# Patient Record
Sex: Male | Born: 1975 | Race: White | Hispanic: No | Marital: Married | Smoking: Never smoker
Health system: Southern US, Community
[De-identification: ages and names within clinical notes are randomized; demographics above are authoritative.]

## PROBLEM LIST (undated history)

## (undated) DIAGNOSIS — E785 Hyperlipidemia, unspecified: Secondary | ICD-10-CM

## (undated) DIAGNOSIS — K219 Gastro-esophageal reflux disease without esophagitis: Secondary | ICD-10-CM

## (undated) DIAGNOSIS — D225 Melanocytic nevi of trunk: Secondary | ICD-10-CM

## (undated) HISTORY — DX: Gastro-esophageal reflux disease without esophagitis: K21.9

## (undated) HISTORY — PX: SKIN LESION EXCISION: SHX2412

## (undated) HISTORY — DX: Hyperlipidemia, unspecified: E78.5

## (undated) HISTORY — PX: TONSILLECTOMY AND ADENOIDECTOMY: SUR1326

## (undated) HISTORY — PX: LASIK: SHX215

## (undated) HISTORY — DX: Melanocytic nevi of trunk: D22.5

---

## 2005-11-03 ENCOUNTER — Ambulatory Visit: Payer: Self-pay | Admitting: Internal Medicine

## 2006-02-09 ENCOUNTER — Ambulatory Visit: Payer: Self-pay | Admitting: Internal Medicine

## 2006-02-22 ENCOUNTER — Ambulatory Visit: Payer: Self-pay | Admitting: Internal Medicine

## 2006-02-24 ENCOUNTER — Ambulatory Visit: Payer: Self-pay | Admitting: Internal Medicine

## 2006-02-24 LAB — CONVERTED CEMR LAB
ALT: 37 units/L (ref 0–40)
AST: 24 units/L (ref 0–37)

## 2006-03-01 ENCOUNTER — Ambulatory Visit: Payer: Self-pay | Admitting: Internal Medicine

## 2006-06-24 ENCOUNTER — Ambulatory Visit: Payer: Self-pay | Admitting: Internal Medicine

## 2006-06-24 LAB — CONVERTED CEMR LAB
ALT: 48 units/L — ABNORMAL HIGH (ref 0–40)
AST: 30 units/L (ref 0–37)
HDL: 46.2 mg/dL (ref >39.0)
VLDL: 30 mg/dL (ref 0–40)

## 2007-05-13 ENCOUNTER — Emergency Department (HOSPITAL_COMMUNITY): Admission: EM | Admit: 2007-05-13 | Discharge: 2007-05-14 | Payer: Self-pay | Admitting: Emergency Medicine

## 2007-05-17 ENCOUNTER — Encounter: Payer: Self-pay | Admitting: Internal Medicine

## 2007-10-12 ENCOUNTER — Ambulatory Visit: Payer: Self-pay | Admitting: *Deleted

## 2007-10-12 DIAGNOSIS — K219 Gastro-esophageal reflux disease without esophagitis: Secondary | ICD-10-CM | POA: Insufficient documentation

## 2007-10-12 DIAGNOSIS — E785 Hyperlipidemia, unspecified: Secondary | ICD-10-CM

## 2007-10-12 DIAGNOSIS — E7800 Pure hypercholesterolemia, unspecified: Secondary | ICD-10-CM | POA: Insufficient documentation

## 2007-10-12 LAB — CONVERTED CEMR LAB
BUN: 15 mg/dL (ref 6–23)
Basophils Relative: 1 % (ref 0.0–3.0)
CO2: 27 meq/L (ref 19–32)
Chloride: 105 meq/L (ref 96–112)
Creatinine, Ser: 1 mg/dL (ref 0.4–1.5)
Eosinophils Relative: 2.4 % (ref 0.0–5.0)
Lymphocytes Relative: 27.9 % (ref 12.0–46.0)
MCV: 84.7 fL (ref 78.0–100.0)
Monocytes Relative: 3.8 % (ref 3.0–12.0)
Neutrophils Relative %: 64.9 % (ref 43.0–77.0)
Platelets: 236 10*3/uL (ref 150–400)
Potassium: 4.2 meq/L (ref 3.5–5.1)
RBC: 4.92 M/uL (ref 4.22–5.81)
WBC: 6 10*3/uL (ref 4.5–10.5)

## 2008-02-07 ENCOUNTER — Ambulatory Visit: Payer: Self-pay | Admitting: Internal Medicine

## 2008-02-13 LAB — CONVERTED CEMR LAB
ALT: 40 units/L (ref 0–53)
AST: 34 units/L (ref 0–37)
Alkaline Phosphatase: 59 units/L (ref 39–117)
Basophils Absolute: 0 10*3/uL (ref 0.0–0.1)
Basophils Relative: 0.1 % (ref 0.0–3.0)
CO2: 29 meq/L (ref 19–32)
Chloride: 105 meq/L (ref 96–112)
Creatinine, Ser: 1 mg/dL (ref 0.4–1.5)
Eosinophils Absolute: 0.2 10*3/uL (ref 0.0–0.7)
Eosinophils Relative: 3.8 % (ref 0.0–5.0)
GFR calc non Af Amer: 91 mL/min
HDL: 53.9 mg/dL (ref >39.0)
Lymphocytes Relative: 33.1 % (ref 12.0–46.0)
MCHC: 35.5 g/dL (ref 30.0–36.0)
MCV: 83.9 fL (ref 78.0–100.0)
Neutrophils Relative %: 55.3 % (ref 43.0–77.0)
Platelets: 228 10*3/uL (ref 150–400)
Potassium: 4.1 meq/L (ref 3.5–5.1)
RBC: 4.95 M/uL (ref 4.22–5.81)
Total Bilirubin: 1.7 mg/dL — ABNORMAL HIGH (ref 0.3–1.2)
Triglycerides: 89 mg/dL (ref 0–149)
VLDL: 18 mg/dL (ref 0–40)
WBC: 4.5 10*3/uL (ref 4.5–10.5)

## 2008-03-12 ENCOUNTER — Telehealth (INDEPENDENT_AMBULATORY_CARE_PROVIDER_SITE_OTHER): Payer: Self-pay | Admitting: *Deleted

## 2008-11-06 ENCOUNTER — Ambulatory Visit: Payer: Self-pay | Admitting: Family Medicine

## 2008-11-06 DIAGNOSIS — IMO0002 Reserved for concepts with insufficient information to code with codable children: Secondary | ICD-10-CM | POA: Insufficient documentation

## 2009-09-24 ENCOUNTER — Ambulatory Visit: Payer: Self-pay | Admitting: Internal Medicine

## 2009-09-25 ENCOUNTER — Ambulatory Visit: Payer: Self-pay | Admitting: Internal Medicine

## 2009-09-26 LAB — CONVERTED CEMR LAB
ALT: 23 units/L (ref 0–53)
AST: 24 units/L (ref 0–37)
Albumin: 4.5 g/dL (ref 3.5–5.2)
Alkaline Phosphatase: 62 units/L (ref 39–117)
Basophils Absolute: 0 10*3/uL (ref 0.0–0.1)
Bilirubin, Direct: 0.2 mg/dL (ref 0.0–0.3)
Chloride: 101 meq/L (ref 96–112)
Cholesterol: 235 mg/dL — ABNORMAL HIGH (ref 0–200)
Creatinine, Ser: 1 mg/dL (ref 0.4–1.5)
Eosinophils Absolute: 0.2 10*3/uL (ref 0.0–0.7)
Lymphocytes Relative: 31.5 % (ref 12.0–46.0)
MCHC: 35.2 g/dL (ref 30.0–36.0)
Monocytes Relative: 9.1 % (ref 3.0–12.0)
Neutro Abs: 2.7 10*3/uL (ref 1.4–7.7)
Neutrophils Relative %: 55.4 % (ref 43.0–77.0)
Platelets: 225 10*3/uL (ref 150.0–400.0)
Potassium: 4.4 meq/L (ref 3.5–5.1)
RDW: 13.1 % (ref 11.5–14.6)
Sodium: 137 meq/L (ref 135–145)
TSH: 1.32 microintl units/mL (ref 0.35–5.50)
Total Protein: 7.2 g/dL (ref 6.0–8.3)
Triglycerides: 115 mg/dL (ref 0.0–149.0)
VLDL: 23 mg/dL (ref 0.0–40.0)

## 2010-04-14 NOTE — Assessment & Plan Note (Signed)
Summary: cpx/cbs   Vital Signs:  Patient profile:   35 year old male Height:      68.25 inches Weight:      176.38 pounds BMI:     26.72 Pulse rate:   67 / minute Pulse rhythm:   regular BP sitting:   126 / 80  (left arm) Cuff size:   large  Vitals Entered By: Army Fossa CMA (September 24, 2009 1:17 PM) CC: CPX: fasting Comments Has a sore on his leg that has been there for 1 year.    History of Present Illness: CPX rash in the R leg x 1 year no itch or pain or d/c   Preventive Screening-Counseling & Management  Caffeine-Diet-Exercise     Times/week: 5  Allergies: 1)  ! Pcn  Past History:  Past Medical History: Reviewed history from 02/07/2008 and no changes required. GERD - controlled with diet / behavioral changes Hyperlipidemia - controlled with diet  Past Surgical History: Tonsillectomy / adenoids - 1980 cyst removed from skin (R hip) LASIK surgery   Family History: Reviewed history from 02/07/2008 and no changes required. 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 CV:  Denies chest pain or discomfort, palpitations, and swelling of feet. Resp:  Denies cough and shortness of breath. GI:  Denies bloody stools, diarrhea, and nausea. GU:  Denies dysuria, hematuria, urinary frequency, and urinary hesitancy. Psych:  patient states he worries a lot , has been that way all his life and does not affect his family life or job.  Physical Exam  General:  alert, well-developed, and well-nourished.   Neck:  no masses, no thyromegaly, and normal carotid upstroke.   Lungs:  normal respiratory effort, no  intercostal retractions, no accessory muscle use, and normal breath sounds.   Heart:  normal rate, regular rhythm, and no murmur.   Abdomen:  soft, non-tender, no masses, no guarding, and no rigidity.   Extremities:  no pretibial edema bilaterally  Skin:  r pretibial area has a minute (1mm) shallow wound w/ mild redness around  Psych:  Cognition and judgment appear intact. Alert and cooperative with normal attention span and concentration.     Impression & Recommendations:  Problem # 1:  HEALTH SCREENING (ICD-V70.0) Td 2004  EKG (baseline)---sinus brady h/o increased cholesterol before, h/o increased LFTs (see previous note) that resolved labs  continue healthy life style  Problem # 2:  minute skin lesion: ?staph infex mupirocin will RTC for excision if no better   Complete Medication List: 1)  Mupirocin 2 % Oint (Mupirocin) .... Apply two times a day x 10 days  Patient Instructions: 1)  came back fasting: 2)  FLP LFTs CBC BMP TSH----dx v70 3)  if the skin lesion is no better , came back for excision 4)  Please schedule a follow-up appointment in 1 year.  Prescriptions: MUPIROCIN 2 % OINT (MUPIROCIN) apply two times a day x 10 days  #1 x 0   Entered and Authorized by:   Nolon Rod. Jaece Ducharme MD   Signed by:   Nolon Rod. Chala Gul MD on 09/24/2009   Method used:   Print then Give to Patient  RxID:   [id number]      Risk Factors:  Exercise:  yes    Times per week:  5

## 2010-06-30 ENCOUNTER — Emergency Department (HOSPITAL_BASED_OUTPATIENT_CLINIC_OR_DEPARTMENT_OTHER)
Admission: EM | Admit: 2010-06-30 | Discharge: 2010-07-01 | Disposition: A | Discharge status: Home / Self Care | Payer: BC Managed Care – PPO | Attending: Emergency Medicine | Admitting: Emergency Medicine

## 2010-06-30 DIAGNOSIS — Y9365 Activity, lacrosse and field hockey: Secondary | ICD-10-CM | POA: Insufficient documentation

## 2010-06-30 DIAGNOSIS — S01501A Unspecified open wound of lip, initial encounter: Secondary | ICD-10-CM | POA: Insufficient documentation

## 2010-06-30 DIAGNOSIS — W219XXA Striking against or struck by unspecified sports equipment, initial encounter: Secondary | ICD-10-CM | POA: Insufficient documentation

## 2010-07-13 ENCOUNTER — Ambulatory Visit (INDEPENDENT_AMBULATORY_CARE_PROVIDER_SITE_OTHER): Payer: Self-pay | Admitting: Family

## 2010-07-13 ENCOUNTER — Encounter: Payer: Self-pay | Admitting: Family

## 2010-07-13 VITALS — BP 120/78 | HR 72 | Temp 97.9°F | Resp 12 | Wt 177.1 lb

## 2010-07-13 DIAGNOSIS — L255 Unspecified contact dermatitis due to plants, except food: Secondary | ICD-10-CM

## 2010-07-13 DIAGNOSIS — L237 Allergic contact dermatitis due to plants, except food: Secondary | ICD-10-CM | POA: Insufficient documentation

## 2010-07-13 MED ORDER — METHYLPREDNISOLONE SODIUM SUCC 125 MG IJ SOLR
125.0000 mg | Freq: Once | INTRAMUSCULAR | Status: AC
  Administered 2010-07-13: 125 mg via INTRAMUSCULAR

## 2010-07-13 MED ORDER — DOXYCYCLINE HYCLATE 100 MG PO TABS: 100.0000 mg | ORAL_TABLET | Freq: Two times a day (BID) | ORAL | Status: AC

## 2010-07-13 MED ORDER — PREDNISONE 10 MG PO TABS: ORAL_TABLET | ORAL | Status: DC

## 2010-07-13 NOTE — Assessment & Plan Note (Signed)
History and exam consistent with poison ivy. Pt was given a dose of IM solumedrol in the office as well as a  prescription for prednisone taper.  He may have an early cellulitis in the right ankle- will rx with doxycycline (he is unsure what his rxn to pen was as a baby)

## 2010-07-13 NOTE — Progress Notes (Signed)
Addended by: Mervin Kung on: 07/13/2010 05:06 PM   Modules accepted: Orders

## 2010-07-13 NOTE — Progress Notes (Signed)
  Subjective:    Patient ID: William Waters, male    DOB: Apr 23, 1975, 35 y.o.   MRN: [id number]  HPI Mr.  Chatterjee is a 35 yr old male who presents today for evaluation of "bug bites."  He notes 1 week history of "bites" and associated blistering rash.  Rash is extremely puritic. He notes some associated redness and swelling of the right ankle.   Upon further questioning, he reports that he and his daughter went walking in the woods just prior to this rash appearing.  She has several small spots on her skin as well.    Review of Systems See HPI  No past medical history on file.  History   Social History  . Marital Status: Married    Spouse Name: N/A    Number of Children: N/A  . Years of Education: N/A   Occupational History  . Not on file.   Social History Main Topics  . Smoking status: Never Smoker   . Smokeless tobacco: Never Used  . Alcohol Use: 2.4 oz/week    4 Cans of beer per week  . Drug Use: No  . Sexually Active: Not on file   Other Topics Concern  . Not on file   Social History Narrative  . No narrative on file    No past surgical history on file.  No family history on file.  Allergies  Allergen Reactions  . Penicillins     No current outpatient prescriptions on file prior to visit.    BP 120/78  Pulse 72  Temp(Src) 97.9 F (36.6 C) (Oral)  Resp 12  Wt 177 lb 1.3 oz (80.323 kg)       Objective:   Physical Exam  Constitutional: He appears well-developed and well-nourished.  Skin:       + blistering rash noted on multiple fingers (dorsal aspect) bilateral ankles R>L.  She has some associated redness and swelling of the right ankle.            Assessment & Plan:

## 2010-07-13 NOTE — Patient Instructions (Signed)
Prednisone 10mg  tabs- take 4 tabs daily for 2 days (start tomorrow), then 3 tabs daily for 2 days, then 2 tabs daily for 2 days then 1 tab daily for 2 days then stop. You may take benadryl 25 mg every 6 hours as needed for itching (may cause drowsiness) You can try oatmeal baths daily until rash is improved. Call if your symptoms worsen or if they do not improve.

## 2010-07-31 NOTE — Assessment & Plan Note (Signed)
[facility]                         GASTROENTEROLOGY OFFICE NOTE   NAME:William Waters, William Waters                         MRN:          [id number]  DATE:03/01/2006                            DOB:          Feb 08, 1976    REASON FOR CONSULTATION:  Rectal bleeding.   HISTORY:  This is a 35 year old white male with no significant past  medical history who is referred through the courtesy of Dr. Drue Novel  regarding rectal bleeding.  The patient reports two weeks ago having an  episode of rectal bleeding.  He described significant amounts of blood  admixed with the stool and in the toilet water.  This problem has since  resolved.  He denies having had similar problems.  There was no  associated abdominal pain, rectal pain or change in bowel habits.  His  weight has been stable.  He did undergo anoscopy with Dr. Drue Novel February 22, 2006.  No external hemorrhoids.  He was noted to have internal  hemorrhoids.  High fiber diet recommended and GI consultation obtained.   PAST MEDICAL HISTORY:  High cholesterol.   PAST SURGICAL HISTORY:  1. Tonsillectomy.  2. Excision of benign lesion from the right thigh.   ALLERGIES:  PENICILLIN.   CURRENT MEDICATIONS:  None.   FAMILY HISTORY:  Paternal grandmother with colon cancer from which she  died.   SOCIAL HISTORY:  The patient is married with an 34-week-old daughter.  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  He does not smoke.  He drinks beer  periodically.   REVIEW OF SYSTEMS:  Per diagnostic evaluation form.   PHYSICAL EXAMINATION:  GENERAL:  Well-appearing male in no acute  distress.  VITAL SIGNS:  Blood pressure 108/64, heart rate is 60 and regular.  Weight 175 pounds.  He is 5 feet 8 inches in height.  HEENT:  Sclerae anicteric.  Conjunctiva pink.  Oral mucosa intact.  No  adenopathy.  LUNGS:  Clear.  HEART:  Regular.  ABDOMEN:  Soft without tenderness,  mass or hernia. Good bowel sounds  heard.  RECTAL:  On anoscopy per Dr. Drue Novel.  Not repeated.  EXTREMITIES:  Without edema.   IMPRESSION:  A 35 year old gentleman with a family history of colon  cancer who presents regarding isolated episode of moderate rectal  bleeding.  Recent anoscopy demonstrating internal hemorrhoids as the  presumed cause of bleeding.  I suspect this was the cause of bleeding  though neoplasia should be excluded.   RECOMMENDATIONS:  Colonoscopy with polypectomy if necessary.  The nature  of the procedure, as well as the risks, benefits and alternatives were  reviewed.  He understood and agreed to proceed.     Wilhemina Bonito. Marina Goodell, MD     JNP/MedQ  DD: 03/01/2006  DT: 03/01/2006  Job #: [id number]   cc:   Willow Ora, MD

## 2010-07-31 NOTE — Assessment & Plan Note (Signed)
[facility]                          [facility] OFFICE NOTE   NAME:Imparato, DAIVON                         MRN:          [id number]  DATE:11/03/2005                            DOB:          Dec 23, 1975    CHIEF COMPLAINT:  Complete physical.   HISTORY OF PRESENT ILLNESS:  Mr. Ramires is a 35 year old white male who came  to the office for the first time to get a complete physical examination.  In  general, he is doing well.  He liked to talk about the possibility of  treatment for anxiety.  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  He leaves in a  rush and from time to time he feels overwhelmed, having palpitations, chest  pain, shortness of breath, in the setting of anxiety.  These episodes happen  maybe once every two or three months.  He also has a difficult time falling  asleep and lately he has been taking two or three drinks at bedtime to, as  the patient put it, get him sleepy.  He admits to having strong personality,  type A, and has been that way all his life.  He occasionally gets snappy but  that is not very frequent.   PAST MEDICAL HISTORY:  1. Status post tonsillectomy and hemorrhoidectomy.  2. Status post right leg biopsy for a lump which was benign.  3. He is currently seen urology for presumed prostatitis and taking      Septra.   FAMILY HISTORY:  1. Father has high cholesterol.  2. Mother has high cholesterol.  3. Grandparents have a history of breast cancer and lupus.  4. No family history of colon or prostate cancer, diabetes or heart      attacks.   SOCIAL HISTORY:  The patient does not smoke, does not do illicit drugs.  Again, he takes 2-3 drinks at bedtime.  He is married.  They are expecting  their first baby next month.   REVIEW OF SYSTEMS:  He denies any nausea, diarrhea or blood in the stools.  He use to exercise quite a bit but here recently  had to stop because of an  ankle injury.   DIET:  He is very busy and has not much time to eat healthy.   MEDICATIONS:  None on a routine basis.   ALLERGIES:  PENICILLIN.   PHYSICAL EXAMINATION:  GENERAL APPEARANCE:  The patient is alert and  oriented in no apparent distress.  VITAL SIGNS:  He is 5 feet 8-1/4 inches tall and weighs 176 with a BMI of  25.  Blood pressure 128/80, pulse 82.  NECK:  No thyromegaly.  LUNGS:  Clear to auscultation bilaterally.  CARDIOVASCULAR:  Regular rate and rhythm without a murmur.  ABDOMEN:  Nondistended, soft, no organomegaly.  EXTREMITIES:  No edema.  NEUROLOGIC:  Speech, gait and motor are intact.  He seems to have intense  personality but is very coherent and cooperative.   LABORATORY AND X-RAY DATA:  EKG shows normal sinus rhythm  with right bundle  branch block.   ASSESSMENT/PLAN:  1. He is here for a complete physical examination.  He was advised about      diet, exercise and dental check-ups.  Will check a CBC, BMP, LFTs,      fasting lipid profile and TSH.  2. The patient has insomnia and I recommend him to use Ambien 10 mg one      p.o. nightly.  Prescription for 30 with no refills was given.  He is      also recommended to stop alcohol at night.  It seems like he is      drinking mostly to get sleepy so Ambien should help with this      situation.  If it is helping, he is to let me know.  3. Anxiety.  I wonder if that is really a diagnosis or is the fact that      the patient has a busy life.  He has type A personality.  I do not      think at this point SSRI is needed.  Rather, I prefer him to have Xanax      0.5 and take half or one twice a day as needed for anxiety,      understanding that the situations when he feels he needs this      medication is very seldom.  I gave him a prescription for 30 pills with      no refills.  I mentioned to him that there is a potential for abuse but      I doubt that he is high risk for that.  4.  He also has occasional palpitations, chest pain and shortness of breath      in the setting of severe anxiety.  The EKG shows a right bundle branch      block.  I doubt that he has any cardiac condition.  Should this      continue or get worse, we may need to do an echo or further evaluation.  5. Office visit once a year and if he is requiring refills for the Ambien      and Xanax, I asked him to come back in six months.                                   Willow Ora, MD   JP/MedQ  DD:  11/03/2005  DT:  11/03/2005  Job #:  [id number]

## 2011-03-26 ENCOUNTER — Ambulatory Visit (INDEPENDENT_AMBULATORY_CARE_PROVIDER_SITE_OTHER): Payer: BC Managed Care – PPO | Admitting: *Deleted

## 2011-03-26 DIAGNOSIS — Z23 Encounter for immunization: Secondary | ICD-10-CM

## 2011-09-13 ENCOUNTER — Encounter: Payer: Self-pay | Admitting: Internal Medicine

## 2011-09-13 ENCOUNTER — Ambulatory Visit (INDEPENDENT_AMBULATORY_CARE_PROVIDER_SITE_OTHER): Payer: BC Managed Care – PPO | Admitting: Internal Medicine

## 2011-09-13 VITALS — BP 140/86 | HR 72 | Temp 98.0°F | Ht 68.5 in | Wt 177.0 lb

## 2011-09-13 DIAGNOSIS — Z0000 Encounter for general adult medical examination without abnormal findings: Secondary | ICD-10-CM

## 2011-09-13 DIAGNOSIS — Z23 Encounter for immunization: Secondary | ICD-10-CM

## 2011-09-13 DIAGNOSIS — L989 Disorder of the skin and subcutaneous tissue, unspecified: Secondary | ICD-10-CM | POA: Insufficient documentation

## 2011-09-13 NOTE — Assessment & Plan Note (Addendum)
Td 04 and today Has a very healthy lifestyle. EKG today Patient wishes to participate on a extreme race, I don't see any indication that he has heart disease. BP slightly elevated today but on previous visits has always been normal.  Labs

## 2011-09-13 NOTE — Patient Instructions (Addendum)
Please come back fasting: CMP, TSH, CBC, FLP--- dx v70

## 2011-09-13 NOTE — Assessment & Plan Note (Signed)
Also complains of a "spot" at the right pretibial area, proximaly. Is red, has been there for few years, it bleeds from time to time. Exam --> papular-red, 3 mm lesion without active bleeding. I told the patient the are needs to be seen by derm an may  needs to be excised. Patient will call me when ready for a dermatology appointment

## 2011-09-13 NOTE — Progress Notes (Signed)
  Subjective:    Patient ID: William Waters, male    DOB: [birth year], 36 y.o.   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 He is going to compeete on a extreme race in November likes to be sure his heart is  good. He does crossfit, is extremely active at least 4 times a week, able to run 10 miles without problems. No chest pain or shortness of breath Note nausea, vomiting, diarrhea or blood in the stools. No anxiety depression No dysuria gross nocturia.     Objective:   Physical Exam General -- alert, well-developed, and well-nourished.   Neck --no thyromegaly Lungs -- normal respiratory effort, no intercostal retractions, no accessory muscle use, and normal breath sounds.   Heart-- normal rate, regular rhythm, no murmur, and no gallop.   Abdomen--soft, non-tender, no distention, no masses, no HSM, no guarding, and no rigidity.   Extremities-- no pretibial edema bilaterally Neurologic-- alert & oriented X3 and strength normal in all extremities. Psych-- Cognition and judgment appear intact. Alert and cooperative with normal attention span and concentration.  not anxious appearing and not depressed appearing.       Assessment & Plan:

## 2011-09-14 ENCOUNTER — Other Ambulatory Visit (INDEPENDENT_AMBULATORY_CARE_PROVIDER_SITE_OTHER): Payer: BC Managed Care – PPO

## 2011-09-14 DIAGNOSIS — Z0000 Encounter for general adult medical examination without abnormal findings: Secondary | ICD-10-CM

## 2011-09-14 LAB — COMPREHENSIVE METABOLIC PANEL
AST: 31 U/L (ref 0–37)
BUN: 18 mg/dL (ref 6–23)
Calcium: 9.5 mg/dL (ref 8.4–10.5)
Chloride: 101 mEq/L (ref 96–112)
Creatinine, Ser: 1.3 mg/dL (ref 0.4–1.5)
Total Bilirubin: 1.8 mg/dL — ABNORMAL HIGH (ref 0.3–1.2)

## 2011-09-14 LAB — LDL CHOLESTEROL, DIRECT: Direct LDL: 142.6 mg/dL

## 2011-09-14 LAB — CBC WITH DIFFERENTIAL/PLATELET
Basophils Relative: 0.7 % (ref 0.0–3.0)
Eosinophils Relative: 1.8 % (ref 0.0–5.0)
Lymphocytes Relative: 35.3 % (ref 12.0–46.0)
MCV: 85.9 fl (ref 78.0–100.0)
Monocytes Absolute: 0.4 10*3/uL (ref 0.1–1.0)
Neutrophils Relative %: 52.6 % (ref 43.0–77.0)
RBC: 4.83 Mil/uL (ref 4.22–5.81)
WBC: 4.2 10*3/uL — ABNORMAL LOW (ref 4.5–10.5)

## 2011-09-14 LAB — LIPID PANEL
HDL: 59.3 mg/dL (ref >39.00)
Total CHOL/HDL Ratio: 4
Triglycerides: 147 mg/dL (ref 0.0–149.0)

## 2011-09-14 NOTE — Progress Notes (Signed)
Labs only

## 2011-09-15 ENCOUNTER — Other Ambulatory Visit: Payer: BC Managed Care – PPO

## 2011-09-20 ENCOUNTER — Encounter: Payer: Self-pay | Admitting: Internal Medicine

## 2011-09-21 ENCOUNTER — Other Ambulatory Visit: Payer: BC Managed Care – PPO

## 2012-04-29 ENCOUNTER — Other Ambulatory Visit: Payer: Self-pay

## 2012-12-11 ENCOUNTER — Ambulatory Visit (INDEPENDENT_AMBULATORY_CARE_PROVIDER_SITE_OTHER): Payer: BC Managed Care – PPO | Admitting: Internal Medicine

## 2012-12-11 ENCOUNTER — Encounter: Payer: Self-pay | Admitting: Internal Medicine

## 2012-12-11 VITALS — BP 121/75 | HR 73 | Temp 98.0°F | Ht 69.8 in | Wt 180.2 lb

## 2012-12-11 DIAGNOSIS — L989 Disorder of the skin and subcutaneous tissue, unspecified: Secondary | ICD-10-CM

## 2012-12-11 DIAGNOSIS — Z0000 Encounter for general adult medical examination without abnormal findings: Secondary | ICD-10-CM

## 2012-12-11 MED ORDER — PREDNISOLONE ACETATE 1 % OP SUSP: OPHTHALMIC | Status: DC

## 2012-12-11 NOTE — Assessment & Plan Note (Signed)
Area has not changed , observation

## 2012-12-11 NOTE — Progress Notes (Signed)
  Subjective:    Patient ID: William Waters, male    DOB: Jun 13, 1975, 37 y.o.   MRN: [id number]  HPI CPX  Past Medical History  Diagnosis Date  . GERD (gastroesophageal reflux disease)     diet controlled   . Hyperlipidemia    Past Surgical History  Procedure Laterality Date  . Tonsillectomy and adenoidectomy  1980s  . Lasik    . Skin lesion excision      cyst, R hip   History   Social History  . Marital Status: Married    Spouse Name: N/A    Number of Children: 2  . Years of Education: N/A   Occupational History  . busines owner     Social History Main Topics  . Smoking status: Never Smoker   . Smokeless tobacco: Never Used  . Alcohol Use: 2.4 oz/week    4 Cans of beer per week     Comment: socially   . Drug Use: No  . Sexual Activity: Not on file   Other Topics Concern  . Not on file   Social History Narrative  . No narrative on file   Family History  Problem Relation Age of Onset  . Prostate cancer Neg Hx   . Colon cancer Neg Hx   . Hyperlipidemia Father     F and M  . CAD Other     GF had a MI  . Diabetes Neg Hx   . Breast cancer Other     GF    Review of Systems Diet-- healthy most of the time Exercise-- cross fit x 4/week, hockey No  CP, SOB, lower extremity edema Denies  nausea, vomiting diarrhea Denies  blood in the stools No GERD  Sx. (-) cough, sputum production No dysuria, gross hematuria, difficulty urinating   No anxiety, depression        Objective:   Physical Exam  Skin:      BP 121/75  Pulse 73  Temp(Src) 98 F (36.7 C)  Ht 5' 9.8" (1.773 m)  Wt 180 lb 3.2 oz (81.738 kg)  BMI 26 kg/m2  SpO2 98% General -- alert, well-developed, NAD.  Neck --no thyromegaly , normal carotid pulse  Lungs -- normal respiratory effort, no intercostal retractions, no accessory muscle use, and normal breath sounds.  Heart-- normal rate, regular rhythm, no murmur.  Abdomen-- Not distended, good bowel sounds,soft, non-tender. No  mass,organomegaly.  Extremities-- no pretibial edema bilaterally  Neurologic--  alert & oriented X3. Speech normal, gait normal, strength normal in all extremities.   Psych-- Cognition and judgment appear intact. Cooperative with normal attention span and concentration. No anxious appearing , no depressed appearing.     Assessment & Plan:

## 2012-12-11 NOTE — Patient Instructions (Addendum)
Please come back fasting: CMP, FLP-- dx V70 Use the eye drops twice a day x 5 days only on the skin around the right eye If not better in the next few days ; do not put the drop in the eye  Testicular Problems and Self-Exam Men can examine themselves easily and effectively with positive results. Monthly exams detect problems early and save lives. There are numerous causes of swelling in the testicle. Testicular cancer usually appears as a firm painless lump in the front part of the testicle. This may feel like a dull ache or heavy feeling located in the lower abdomen (belly), groin, or scrotum.  The risk is greater in men with undescended testicles and it is more common in young men. It is responsible for almost a fifth of cancers in males between ages 97 and 29. Other common causes of swellings, lumps, and testicular pain include injuries, inflammation (soreness) from infection, hydrocele, and torsion. These are a few of the reasons to do monthly self-examination of the testicles. The exam only takes minutes and could add years to your life. Get in the habit! SELF-EXAMINATION OF THE TESTICLES The testicles are easiest to examine after warm baths or showers and are more difficult to examine when you are cold. This is because the muscles attached to the testicles retract and pull them up higher or into the abdomen. While standing, roll one testicle between the thumb and forefinger. Feel for lumps, swelling, or discomfort. A normal testicle is egg shaped and feels firm. It is smooth and not tender. The spermatic cord can be felt as a firm spaghetti-like cord at the back of the testicle. It is also important to examine your groins. This is the crease between the front of your leg and your abdomen. Also, feel for enlarged lymph nodes (glands). Enlarged nodes are also a cause for you to see your caregiver for evaluation.  Self-examination of the testicles and groin areas on a regular basis will help you to know  what your own testicles and groins feel like. This will help you pick up an abnormality (difference) at an earlier stage. Early discovery is the key to curing this cancer or treating other conditions. Any lump, change, or swelling in the testicle calls for immediate evaluation by your caregiver. Cancer of the testicle does not result in impotence and it does not prevent normal intercourse or prevent having children. If your caregiver feels that medical treatment or chemotherapy could lead to infertility, sperm can be frozen for future use. It is necessary to see a caregiver as soon as possible after the discovery of a lump in a testicle. Document Released: 06/07/2000 Document Revised: 05/24/2011 Document Reviewed: 03/02/2008 [facility] Patient Information 2014 [place], [place].

## 2012-12-11 NOTE — Assessment & Plan Note (Addendum)
Td 2013 STE Has a very healthy lifestyle. Labs  Other issue: rash around eye likely eczema, to use topical steroids if needed , see instructions

## 2012-12-21 ENCOUNTER — Ambulatory Visit (INDEPENDENT_AMBULATORY_CARE_PROVIDER_SITE_OTHER): Payer: BC Managed Care – PPO

## 2012-12-21 ENCOUNTER — Other Ambulatory Visit (INDEPENDENT_AMBULATORY_CARE_PROVIDER_SITE_OTHER): Payer: BC Managed Care – PPO

## 2012-12-21 DIAGNOSIS — E785 Hyperlipidemia, unspecified: Secondary | ICD-10-CM

## 2012-12-21 DIAGNOSIS — Z0000 Encounter for general adult medical examination without abnormal findings: Secondary | ICD-10-CM

## 2012-12-21 DIAGNOSIS — Z23 Encounter for immunization: Secondary | ICD-10-CM

## 2012-12-21 LAB — CBC WITH DIFFERENTIAL/PLATELET
Basophils Absolute: 0 10*3/uL (ref 0.0–0.1)
Basophils Relative: 0.7 % (ref 0.0–3.0)
Hemoglobin: 14.4 g/dL (ref 13.0–17.0)
Lymphocytes Relative: 35.9 % (ref 12.0–46.0)
MCV: 84.6 fl (ref 78.0–100.0)
Monocytes Absolute: 0.4 10*3/uL (ref 0.1–1.0)
Monocytes Relative: 8.7 % (ref 3.0–12.0)
Neutro Abs: 2.4 10*3/uL (ref 1.4–7.7)
Neutrophils Relative %: 52.7 % (ref 43.0–77.0)
Platelets: 222 10*3/uL (ref 150.0–400.0)
RDW: 13.1 % (ref 11.5–14.6)

## 2012-12-21 LAB — COMPREHENSIVE METABOLIC PANEL
Alkaline Phosphatase: 58 U/L (ref 39–117)
CO2: 27 mEq/L (ref 19–32)
Calcium: 9.6 mg/dL (ref 8.4–10.5)
Creatinine, Ser: 1.1 mg/dL (ref 0.4–1.5)
GFR: 82.51 mL/min (ref >60.00)
Glucose, Bld: 87 mg/dL (ref 70–99)
Potassium: 4.7 mEq/L (ref 3.5–5.1)
Sodium: 136 mEq/L (ref 135–145)
Total Bilirubin: 1.6 mg/dL — ABNORMAL HIGH (ref 0.3–1.2)
Total Protein: 7.4 g/dL (ref 6.0–8.3)

## 2012-12-21 LAB — LIPID PANEL
Cholesterol: 262 mg/dL — ABNORMAL HIGH (ref 0–200)
HDL: 65.3 mg/dL (ref >39.00)
Total CHOL/HDL Ratio: 4
VLDL: 21 mg/dL (ref 0.0–40.0)

## 2012-12-21 LAB — LDL CHOLESTEROL, DIRECT: Direct LDL: 175.1 mg/dL

## 2012-12-22 ENCOUNTER — Other Ambulatory Visit: Payer: BC Managed Care – PPO

## 2012-12-27 ENCOUNTER — Encounter: Payer: Self-pay | Admitting: *Deleted

## 2012-12-27 ENCOUNTER — Telehealth: Payer: Self-pay | Admitting: Internal Medicine

## 2012-12-27 NOTE — Telephone Encounter (Signed)
Patient called to return William Waters phone call about his la results. thanks

## 2012-12-27 NOTE — Telephone Encounter (Signed)
Pt notified via tele of lab results and plan. DJR

## 2013-02-01 ENCOUNTER — Other Ambulatory Visit (INDEPENDENT_AMBULATORY_CARE_PROVIDER_SITE_OTHER): Payer: BC Managed Care – PPO

## 2013-02-01 DIAGNOSIS — E785 Hyperlipidemia, unspecified: Secondary | ICD-10-CM

## 2013-02-01 LAB — LIPID PANEL
Cholesterol: 203 mg/dL — ABNORMAL HIGH (ref 0–200)
HDL: 51.4 mg/dL (ref >39.00)
Total CHOL/HDL Ratio: 4
Triglycerides: 128 mg/dL (ref 0.0–149.0)

## 2013-02-15 ENCOUNTER — Ambulatory Visit (INDEPENDENT_AMBULATORY_CARE_PROVIDER_SITE_OTHER): Payer: BC Managed Care – PPO | Admitting: Internal Medicine

## 2013-02-15 ENCOUNTER — Encounter: Payer: Self-pay | Admitting: Internal Medicine

## 2013-02-15 VITALS — BP 121/67 | HR 76 | Temp 98.7°F | Wt 175.0 lb

## 2013-02-15 DIAGNOSIS — M25562 Pain in left knee: Secondary | ICD-10-CM

## 2013-02-15 DIAGNOSIS — M25569 Pain in unspecified knee: Secondary | ICD-10-CM | POA: Insufficient documentation

## 2013-02-15 LAB — CBC WITH DIFFERENTIAL/PLATELET
Basophils Relative: 0.3 % (ref 0.0–3.0)
Eosinophils Relative: 0.9 % (ref 0.0–5.0)
HCT: 41.2 % (ref 39.0–52.0)
Lymphs Abs: 1.7 10*3/uL (ref 0.7–4.0)
MCHC: 34.6 g/dL (ref 30.0–36.0)
MCV: 84.8 fl (ref 78.0–100.0)
Monocytes Absolute: 0.5 10*3/uL (ref 0.1–1.0)
Monocytes Relative: 8.5 % (ref 3.0–12.0)
RBC: 4.85 Mil/uL (ref 4.22–5.81)
WBC: 6.4 10*3/uL (ref 4.5–10.5)

## 2013-02-15 LAB — URIC ACID: Uric Acid, Serum: 4.9 mg/dL (ref 4.0–7.8)

## 2013-02-15 NOTE — Patient Instructions (Signed)
Motrin 200 mg 2 tablets every 6 hours as needed for pain. Always take it with food because may cause gastritis and ulcers. If you notice nausea, stomach pain, change in the color of stools --->  Stop the medicine and let us know  ICE 3 times a day

## 2013-02-15 NOTE — Assessment & Plan Note (Addendum)
Acute monoarthritis with evidence of prepatellar bursitis and knee effusion. Differential diagnoses includes gout, septic joint, overuse. I think we need to attempt to get some fluid from the knee, refer to ortho today. Check a uric acid and CBC

## 2013-02-15 NOTE — Progress Notes (Signed)
Pre visit review using our clinic review tool, if applicable. No additional management support is needed unless otherwise documented below in the visit note. 

## 2013-02-15 NOTE — Progress Notes (Signed)
   Subjective:    Patient ID: William Waters, male    DOB: 12-11-1975, 37 y.o.   MRN: [id number]  HPI Acute visit 2 nights ago woke with severe pain and swelling at the left knee. The day prior, he did all his normal activities including going to the gym and playing hockey without any injury. Denies any previous occurrences like this one, no history of prior surgery on the knee, history of gout?.  Past Medical History  Diagnosis Date  . GERD (gastroesophageal reflux disease)     diet controlled   . Hyperlipidemia    Past Surgical History  Procedure Laterality Date  . Tonsillectomy and adenoidectomy  1980s  . Lasik    . Skin lesion excision      cyst, R hip     Review of Systems Denies fever or chills    Objective:   Physical Exam  BP 121/67  Pulse 76  Temp(Src) 98.7 F (37.1 C)  Wt 175 lb (79.379 kg)  SpO2 97% General -- alert, well-developed, NAD.   Extremities--  no pretibial edema bilaterally  Right knee normal. Left knee: Slightly red, warm, very tender at the prepatellar area, mild knee effusion on exam, range of motion is normal, joint is stable neurologic--   .alert & oriented X3. Speech normal, gait normal, strength normal in all extremities.  Psych-- Cognition and judgment appear intact. Cooperative with normal attention span and concentration. No anxious appearing , no depressed appearing.     Assessment & Plan:

## 2013-05-15 ENCOUNTER — Telehealth: Payer: Self-pay | Admitting: Internal Medicine

## 2013-05-15 DIAGNOSIS — L309 Dermatitis, unspecified: Secondary | ICD-10-CM

## 2013-05-15 NOTE — Telephone Encounter (Signed)
Patient called and stated that the eye drops that dr Larose Kells called in for him in September is not working. He would like to referred to a dermatologist . Please advise

## 2013-05-15 NOTE — Telephone Encounter (Addendum)
Arrange a dermatology referral, dx face eczema

## 2013-05-16 NOTE — Telephone Encounter (Signed)
Referral ordered

## 2013-05-16 NOTE — Addendum Note (Signed)
Addended by: Peggyann Shoals on: 05/16/2013 08:04 AM   Modules accepted: Orders

## 2013-11-14 ENCOUNTER — Ambulatory Visit (INDEPENDENT_AMBULATORY_CARE_PROVIDER_SITE_OTHER): Payer: BC Managed Care – PPO | Admitting: Sports Medicine

## 2013-11-14 ENCOUNTER — Encounter: Payer: Self-pay | Admitting: Sports Medicine

## 2013-11-14 VITALS — BP 143/80 | HR 60 | Ht 68.0 in | Wt 170.0 lb

## 2013-11-14 DIAGNOSIS — M25561 Pain in right knee: Secondary | ICD-10-CM

## 2013-11-14 DIAGNOSIS — M25569 Pain in unspecified knee: Secondary | ICD-10-CM

## 2013-11-14 DIAGNOSIS — M25562 Pain in left knee: Principal | ICD-10-CM

## 2013-11-14 DIAGNOSIS — M222X9 Patellofemoral disorders, unspecified knee: Secondary | ICD-10-CM | POA: Insufficient documentation

## 2013-11-14 DIAGNOSIS — M222X2 Patellofemoral disorders, left knee: Secondary | ICD-10-CM

## 2013-11-14 NOTE — Assessment & Plan Note (Addendum)
Exam and description c/w PFPS.  No concerning findings on exam or [place] today.  Body Helix given for b/l knees and exercises given to strengthen the VMO of the L knee.  F/U PRN.

## 2013-11-14 NOTE — Progress Notes (Signed)
William Waters is a 38 y.o. male who presents today for L knee pain.  L Knee pain - Pt states that this pain has been ongoing now for about 2 yrs, after he partook in a [employer] activity.  Pain was located around the patella that was nonspecific, was worse after running or heavy squatting in the gym.  He would notice it the next day and had dull, achy pain, located in this area that would worsen with prolonged rest or trying to get up from a chair.  This has been intermittent during this time frame which has prevented him from being seen for it sooner.  He did partake in another [employer] activity about 1 week ago in which he wore his wife's knee brace (velcro strap w/ center piece missing) that states did help his Sx.  He denies any type of effusion in the knee, locking, catching, giving out, previous injury to the knee or leg, weakness, or paresthesias down this side.  He does participate in cross fit and high intensity training but does not run regularly   Past Medical History  Diagnosis Date  . GERD (gastroesophageal reflux disease)     diet controlled   . Hyperlipidemia     History  Smoking status  . Never Smoker   Smokeless tobacco  . Never Used    Family History  Problem Relation Age of Onset  . Prostate cancer Neg Hx   . Colon cancer Neg Hx   . Hyperlipidemia Father     F and M  . CAD Other     GF had a MI  . Diabetes Neg Hx   . Breast cancer Other     GF    Current Outpatient Prescriptions on File Prior to Visit  Medication Sig Dispense Refill  . prednisoLONE acetate (PRED FORTE) 1 % ophthalmic suspension As directed  5 mL  0   No current facility-administered medications on file prior to visit.    ROS: Per HPI.  All other systems reviewed and are negative.   Physical Exam Filed Vitals:   11/14/13 1519  BP: 143/80  Pulse: 60    Physical Examination:  Knee: Normal to inspection with no erythema or effusion or obvious bony abnormalities. Palpation normal  with no warmth or joint line tenderness or patellar tenderness or condyle tenderness.  No crepitus palpated with knee extension.  Patella w/o gross subluxation or tight lateral retinaculum  ROM normal in flexion and extension and lower leg rotation. Ligaments with solid consistent endpoints including ACL, PCL, LCL, MCL. Negative Mcmurray's and provocative meniscal tests. Non painful patellar compression. Patellar and quadriceps tendons unremarkable. Hamstring and quadriceps strength is normal.  Gait - Pt with severe intoeing of the L foot with running and some loss of the longitudinal arch in this foot

## 2014-03-15 DIAGNOSIS — D225 Melanocytic nevi of trunk: Secondary | ICD-10-CM

## 2014-03-15 HISTORY — DX: Melanocytic nevi of trunk: D22.5

## 2014-03-15 HISTORY — PX: VASECTOMY: SHX75

## 2014-05-03 ENCOUNTER — Ambulatory Visit (INDEPENDENT_AMBULATORY_CARE_PROVIDER_SITE_OTHER): Payer: 59 | Admitting: Internal Medicine

## 2014-05-03 ENCOUNTER — Encounter: Payer: Self-pay | Admitting: Internal Medicine

## 2014-05-03 ENCOUNTER — Other Ambulatory Visit: Payer: Self-pay | Admitting: Internal Medicine

## 2014-05-03 VITALS — BP 121/73 | HR 61 | Temp 98.1°F | Ht 68.0 in | Wt 172.2 lb

## 2014-05-03 DIAGNOSIS — L989 Disorder of the skin and subcutaneous tissue, unspecified: Secondary | ICD-10-CM | POA: Diagnosis not present

## 2014-05-03 DIAGNOSIS — E785 Hyperlipidemia, unspecified: Secondary | ICD-10-CM

## 2014-05-03 DIAGNOSIS — Z0000 Encounter for general adult medical examination without abnormal findings: Secondary | ICD-10-CM | POA: Diagnosis not present

## 2014-05-03 LAB — COMPREHENSIVE METABOLIC PANEL
ALBUMIN: 4.5 g/dL (ref 3.5–5.2)
ALK PHOS: 66 U/L (ref 39–117)
ALT: 28 U/L (ref 0–53)
AST: 29 U/L (ref 0–37)
BUN: 13 mg/dL (ref 6–23)
CO2: 28 mEq/L (ref 19–32)
CREATININE: 1.03 mg/dL (ref 0.40–1.50)
Calcium: 9.6 mg/dL (ref 8.4–10.5)
Chloride: 97 mEq/L (ref 96–112)
GFR: 84.7 mL/min (ref >60.00)
GLUCOSE: 94 mg/dL (ref 70–99)
POTASSIUM: 4.2 meq/L (ref 3.5–5.1)
Sodium: 133 mEq/L — ABNORMAL LOW (ref 135–145)
Total Bilirubin: 1.5 mg/dL — ABNORMAL HIGH (ref 0.2–1.2)
Total Protein: 7.1 g/dL (ref 6.0–8.3)

## 2014-05-03 LAB — TSH: TSH: 1.21 u[IU]/mL (ref 0.35–4.50)

## 2014-05-03 NOTE — Progress Notes (Signed)
Pre visit review using our clinic review tool, if applicable. No additional management support is needed unless otherwise documented below in the visit note. 

## 2014-05-03 NOTE — Patient Instructions (Signed)
Get your blood work before you leave     Please schedule the following before you leave the office at the front desk: A office visit to see me in 1 year for a  physical exam. Come back fasting

## 2014-05-03 NOTE — Assessment & Plan Note (Addendum)
Td 2013 Counseled continue with his very healthy lifestyle and continue doing STE Previous labs reviewed. Despite his healthy lifestyle cholesterol is in the high side. At this time will check a NMR to see the quality of his cholesterol, he has a mild family history of CAD.  Other issues--  continue with a skin lesion at the right pretibial area, likes it to be excised. It may be a difficult excision, will refer him to general surgery Right ear feeling full, exam is essentially normal, recommend Flonase for a month

## 2014-05-03 NOTE — Progress Notes (Signed)
Subjective:    Patient ID: William Waters, male    DOB: [birth year], 39 y.o.   MRN: [id number]  DOS:  05/03/2014 Type of visit - description : cpx Interval history: In general doing well, just complaining of right ear feeling congested for 2 weeks. Denies other HEENT symptoms   Review of Systems Constitutional: No fever, chills. No unexplained wt changes. No unusual sweats HEENT: No dental problems, ear discharge, facial swelling, voice changes. No eye discharge, redness or intolerance to light Respiratory: No wheezing or difficulty breathing. No cough , mucus production Cardiovascular: No CP, leg swelling or palpitations GI: no nausea, vomiting, diarrhea or abdominal pain.  No blood in the stools. No dysphagia   Endocrine: No polyphagia, polyuria or polydipsia GU: No dysuria, gross hematuria, difficulty urinating. No urinary urgency or frequency. Musculoskeletal: No joint swellings or unusual aches or pains Skin: No change in the color of the skin, palor or rash Allergic, immunologic: No environmental allergies or food allergies Neurological: No dizziness or syncope. No headaches. No diplopia, slurred speech, motor deficits, facial numbness Hematological: No enlarged lymph nodes, easy bruising or bleeding Psychiatry: No suicidal ideas, hallucinations, behavior problems or confusion. No unusual/severe anxiety or depression.     Past Medical History  Diagnosis Date  . GERD (gastroesophageal reflux disease)     diet controlled   . Hyperlipidemia     Past Surgical History  Procedure Laterality Date  . Tonsillectomy and adenoidectomy  1980s  . Lasik    . Skin lesion excision      cyst, R hip  . Vasectomy  2016    Dr Amalia Hailey    History   Social History  . Marital Status: Married    Spouse Name: N/A  . Number of Children: 3  . Years of Education: N/A   Occupational History  . busines owner     Social History Main Topics  . Smoking status: Never Smoker   . Smokeless  tobacco: Never Used  . Alcohol Use: 2.4 oz/week    4 Cans of beer per week     Comment: socially   . Drug Use: No  . Sexual Activity: Not on file   Other Topics Concern  . Not on file   Social History Narrative   Household-- wife and 3 children last born 02-2014     Family History  Problem Relation Age of Onset  . Prostate cancer Neg Hx   . Colon cancer Neg Hx   . Hyperlipidemia Father     F and M  . CAD Other     GF had a MI  . Diabetes Neg Hx   . Breast cancer Other     GF       Medication List    Notice  As of 05/03/2014 11:59 PM   You have not been prescribed any medications.         Objective:   Physical Exam BP 121/73 mmHg  Pulse 61  Temp(Src) 98.1 F (36.7 C) (Oral)  Ht 5\' 8"  (1.727 m)  Wt 172 lb 4 oz (78.132 kg)  BMI 26.20 kg/m2  SpO2 99% General:   Well developed, well nourished . NAD.  Neck:  Full range of motion. Supple. No  thyromegaly ,   HEENT:  Normocephalic . Face symmetric, atraumatic. Tympanic membranes without redness, bulging or discharge. The right TM has some scar tissue but otherwise looks healthy Lungs:  CTA B Normal respiratory effort, no intercostal retractions, no accessory muscle  use. Heart: RRR,  no murmur.  Abdomen:  Not distended, soft, non-tender. No rebound or rigidity. No mass,organomegaly Muscle skeletal: no pretibial edema bilaterally  Skin: Exposed areas without rash. Not pale. Not jaundice Neurologic:  alert & oriented X3.  Speech normal, gait appropriate for age and unassisted Strength symmetric and appropriate for age.  Psych: Cognition and judgment appear intact.  Cooperative with normal attention span and concentration.  Behavior appropriate. No anxious or depressed appearing.       Assessment & Plan:

## 2014-05-07 LAB — NMR LIPOPROFILE WITH LIPIDS
Cholesterol, Total: 225 mg/dL — ABNORMAL HIGH (ref 100–199)
HDL Particle Number: 42.8 umol/L (ref >=30.5)
HDL SIZE: 9.2 nm (ref >=9.2)
HDL-C: 76 mg/dL (ref >39)
LARGE HDL: 10.5 umol/L (ref >=4.8)
LARGE VLDL-P: 2.8 nmol/L — AB (ref <=2.7)
LDL (calc): 125 mg/dL — ABNORMAL HIGH (ref 0–99)
LDL Particle Number: 1201 nmol/L — ABNORMAL HIGH (ref <1000)
LDL Size: 21 nm (ref >=20.8)
LP-IR Score: 32 (ref <=45)
Small LDL Particle Number: 440 nmol/L (ref <=527)
Triglycerides: 118 mg/dL (ref 0–149)
VLDL Size: 38.4 nm (ref <=46.6)

## 2014-05-27 ENCOUNTER — Telehealth: Payer: Self-pay | Admitting: Internal Medicine

## 2014-05-27 ENCOUNTER — Emergency Department (HOSPITAL_BASED_OUTPATIENT_CLINIC_OR_DEPARTMENT_OTHER)
Admission: EM | Admit: 2014-05-27 | Discharge: 2014-05-27 | Disposition: A | Discharge status: Home / Self Care | Payer: 59 | Attending: Emergency Medicine | Admitting: Emergency Medicine

## 2014-05-27 ENCOUNTER — Encounter (HOSPITAL_BASED_OUTPATIENT_CLINIC_OR_DEPARTMENT_OTHER): Payer: Self-pay | Admitting: Emergency Medicine

## 2014-05-27 DIAGNOSIS — H7291 Unspecified perforation of tympanic membrane, right ear: Secondary | ICD-10-CM | POA: Insufficient documentation

## 2014-05-27 DIAGNOSIS — Z880 Allergy status to penicillin: Secondary | ICD-10-CM | POA: Diagnosis not present

## 2014-05-27 DIAGNOSIS — S0991XA Unspecified injury of ear, initial encounter: Secondary | ICD-10-CM | POA: Diagnosis present

## 2014-05-27 DIAGNOSIS — Y998 Other external cause status: Secondary | ICD-10-CM | POA: Diagnosis not present

## 2014-05-27 DIAGNOSIS — Y9289 Other specified places as the place of occurrence of the external cause: Secondary | ICD-10-CM | POA: Insufficient documentation

## 2014-05-27 DIAGNOSIS — Z8639 Personal history of other endocrine, nutritional and metabolic disease: Secondary | ICD-10-CM | POA: Diagnosis not present

## 2014-05-27 DIAGNOSIS — Y9389 Activity, other specified: Secondary | ICD-10-CM | POA: Insufficient documentation

## 2014-05-27 DIAGNOSIS — W228XXA Striking against or struck by other objects, initial encounter: Secondary | ICD-10-CM | POA: Insufficient documentation

## 2014-05-27 DIAGNOSIS — Z8619 Personal history of other infectious and parasitic diseases: Secondary | ICD-10-CM | POA: Diagnosis not present

## 2014-05-27 NOTE — Telephone Encounter (Signed)
[facility] - Client TELEPHONE ADVICE RECORD [facility] Patient Name: William Waters DOB: 04/01/1975 Initial Comment Caller states he was pulling tree branches Saturday when penetrated his ear canal, still has bleeding and hearing loss, xfer from office Nurse Assessment Nurse: Donalynn Furlong, RN, Myna Hidalgo Date/Time Eilene Ghazi Time): 05/27/2014 8:13:47 AM Confirm and document reason for call. If symptomatic, describe symptoms. ---Caller states he was pulling tree branches Saturday when penetrated his ear canal, still has bleeding and hearing loss, xfer from office. Afebrile Has the patient traveled out of the country within the last 30 days? ---Not Applicable Does the patient require triage? ---Yes Related visit to physician within the last 2 weeks? ---No Does the PT have any chronic conditions? (i.e. diabetes, asthma, etc.) ---No Guidelines Guideline Title Affirmed Question Affirmed Notes Ear Injury [1] Pointed object was inserted into the ear canal AND [2] now has bleeding or earache (Exception: doctor's ear exam) Final Disposition User Go to ED Now Donalynn Furlong, RN, Myna Hidalgo

## 2014-05-27 NOTE — ED Notes (Signed)
Agree with MD assessment.  Patient complains of right ear pain.

## 2014-05-27 NOTE — Discharge Instructions (Signed)
Eardrum Perforation The eardrum is a thin, round tissue inside the ear. It allows you to hear. The eardrum can get torn (perforated). Eardrums often heal on their own. There is often little or no long-term hearing loss. HOME CARE   Keep your ear dry while it heals. Do not swim, dive, or take showers until your doctor says it is okay.  Before you take a bath, put petroleum jelly all over a cotton ball. Put the cotton ball in your ear. This will keep water out.  Only take medicines as told by your doctor.  Blow your nose gently.  Continue normal activities after your eardrum heals. Your doctor will tell you when your eardrum has healed.  Talk to your doctor before flying on an airplane.  Keep all doctor visits as told. This is important. GET HELP RIGHT AWAY IF:   You have blood or yellowish-white fluid (pus) coming from your ear.  You feel off balance.  You feel dizzy, sick to your stomach (nauseous), or you throw up (vomit).  You have more pain.  You have a fever. MAKE SURE YOU:   Understand these instructions.  Will watch your condition.  Will get help right away if you are not doing well or get worse. Document Released: 08/19/2009 Document Revised: 05/24/2011 Document Reviewed: 08/19/2009 [facility] Patient Information 2015 [place], [place]. This information is not intended to replace advice given to you by your health care provider. Make sure you discuss any questions you have with your health care provider.

## 2014-05-27 NOTE — ED Provider Notes (Signed)
CSN: [id number]     Arrival date & time 05/27/14  1000 History   First MD Initiated Contact with Patient 05/27/14 1006     Chief Complaint  Patient presents with  . Otalgia     HPI  Social history evaluation of a right or injury. He was working in a tree trimming some branches. He states a small branch went into his ear. His medially painful. He went inside. His wife flushed his ear with some peroxide and he states "blood and barking "came out. He has had some pain and bleeding the last 2 nights, and his hearing remains somewhat diminished.  Past Medical History  Diagnosis Date  . GERD (gastroesophageal reflux disease)     diet controlled   . Hyperlipidemia    Past Surgical History  Procedure Laterality Date  . Tonsillectomy and adenoidectomy  1980s  . Lasik    . Skin lesion excision      cyst, R hip  . Vasectomy  2016    Dr Amalia Hailey   Family History  Problem Relation Age of Onset  . Prostate cancer Neg Hx   . Colon cancer Neg Hx   . Hyperlipidemia Father     F and M  . CAD Other     GF had a MI  . Diabetes Neg Hx   . Breast cancer Other     GF   History  Substance Use Topics  . Smoking status: Never Smoker   . Smokeless tobacco: Never Used  . Alcohol Use: 2.4 oz/week    4 Cans of beer per week     Comment: socially     Review of Systems  HENT: Positive for ear discharge, ear pain and hearing loss.       Allergies  Penicillins  Home Medications   Prior to Admission medications   Not on File   BP 120/71 mmHg  Pulse 65  Temp(Src) 98.1 F (36.7 C) (Oral)  Resp 16  Ht 5\' 8"  (1.727 m)  Wt 170 lb (77.111 kg)  BMI 25.85 kg/m2  SpO2 97% Physical Exam  HENT:  Right Ear: Tympanic membrane is perforated. Tympanic membrane is not erythematous.  Ears:    ED Course  Procedures (including critical care time) Labs Review Labs Reviewed - No data to display  Imaging Review No results found.   EKG Interpretation None      MDM   Final diagnoses:    Tympanic membrane perforation, right   Exam shows an abrasion to canal and a small central TM perforation. No hematotympanum. Canal does not appear otherwise compromise or swollen. Discussed with him that these will often heal. Given him ENT for follow-up. Avastin the call in one week with persistent pain, bleeding, or if his hearing remains diminished.     Tanna Furry, MD 05/27/14 1036

## 2014-05-27 NOTE — ED Notes (Signed)
Working in yard and had branch stick in ear. Pt states had blood afterwards from ear. Also states today pain is worse, muffed in sound and continued to have pain in right ear. Pt wife flushed ear out with peroxide and got bark pieces from ear. Pt states when flushed out pain was really bad.

## 2014-05-27 NOTE — Telephone Encounter (Signed)
Patient admitted to [facility].

## 2014-05-31 ENCOUNTER — Encounter: Payer: Self-pay | Admitting: Medical

## 2014-05-31 ENCOUNTER — Ambulatory Visit (INDEPENDENT_AMBULATORY_CARE_PROVIDER_SITE_OTHER): Payer: 59 | Admitting: Medical

## 2014-05-31 VITALS — BP 125/80 | HR 65 | Temp 97.9°F | Ht 68.0 in | Wt 176.6 lb

## 2014-05-31 DIAGNOSIS — H7290 Unspecified perforation of tympanic membrane, unspecified ear: Secondary | ICD-10-CM | POA: Insufficient documentation

## 2014-05-31 DIAGNOSIS — H7291 Unspecified perforation of tympanic membrane, right ear: Secondary | ICD-10-CM

## 2014-05-31 MED ORDER — AZITHROMYCIN 250 MG PO TABS: ORAL_TABLET | ORAL | Status: DC

## 2014-05-31 NOTE — Assessment & Plan Note (Addendum)
Will rx azithromycin antibiotic. Will go ahead and refer you to ENT hopefully in next week. Any worsening or changing signs or symptoms let us know.

## 2014-05-31 NOTE — Patient Instructions (Signed)
Perforated tympanic membrane Will rx azithromycin antibiotic. Will go ahead and refer you to ENT hopefully in next week. Any worsening or changing signs or symptoms let us know.    Follow up in 7 days if any worse/changing symptoms or as needed.  We will call you on the referral.

## 2014-05-31 NOTE — Progress Notes (Signed)
Subjective:    Patient ID: William Waters, male    DOB: 05-11-1975, 39 y.o.   MRN: [id number]  HPI  Pt in states he was cleaning some trees around his house. He pulling down branch. And branch went in his ear. It caused a lot of pain. His wife flushed out the ear and bark with blood came out.   He has no pain. But odd sound when he opens and closes mouth. No fever, no chills.    Review of Systems  Constitutional: Negative for fever, chills and fatigue.  HENT: Negative for congestion, ear pain, mouth sores, postnasal drip, rhinorrhea, sinus pressure, sore throat and trouble swallowing.        Ear perforation rt side.  Respiratory: Negative for cough, chest tightness and wheezing.   Cardiovascular: Negative for chest pain and palpitations.  Musculoskeletal: Negative for back pain.  Neurological: Negative for dizziness and light-headedness.  Hematological: Negative for adenopathy. Does not bruise/bleed easily.   Past Medical History  Diagnosis Date  . GERD (gastroesophageal reflux disease)     diet controlled   . Hyperlipidemia     History   Social History  . Marital Status: Married    Spouse Name: N/A  . Number of Children: 3  . Years of Education: N/A   Occupational History  . busines owner     Social History Main Topics  . Smoking status: Never Smoker   . Smokeless tobacco: Never Used  . Alcohol Use: 2.4 oz/week    4 Cans of beer per week     Comment: socially   . Drug Use: No  . Sexual Activity: Not on file   Other Topics Concern  . Not on file   Social History Narrative   Household-- wife and 3 children last born 02-2014    Past Surgical History  Procedure Laterality Date  . Tonsillectomy and adenoidectomy  1980s  . Lasik    . Skin lesion excision      cyst, R hip  . Vasectomy  2016    Dr Amalia Hailey    Family History  Problem Relation Age of Onset  . Prostate cancer Neg Hx   . Colon cancer Neg Hx   . Hyperlipidemia Father     F and M  . CAD Other      GF had a MI  . Diabetes Neg Hx   . Breast cancer Other     GF    Allergies  Allergen Reactions  . Penicillins     No current outpatient prescriptions on file prior to visit.   No current facility-administered medications on file prior to visit.    BP 125/80 mmHg  Pulse 65  Temp(Src) 97.9 F (36.6 C) (Oral)  Ht 5\' 8"  (1.727 m)  Wt 176 lb 9.6 oz (80.105 kg)  BMI 26.86 kg/m2  SpO2 97%      Objective:   Physical Exam  General  Mental Status - Alert. General Appearance - Well groomed. Not in acute distress.  Skin Rashes- No Rashes.  HEENT Head- Normal. Ear Auditory Canal - Left- wax mixed blood. Partial obstruction Part of tm  I see is not perforated (but I see only top 1/3 portion) Right - Normal.Tympanic Membrane- Left- only saw top 1/3. Bottom 2/3 not seen.. Right- Normal. Eye Sclera/Conjunctiva- Left- Normal. Right- Normal. Nose & Sinuses Nasal Mucosa- Left-  Not boggy or Congested. Right-  Not  boggy or Congested. Mouth & Throat Lips: Upper Lip- Normal: no  dryness, cracking, pallor, cyanosis, or vesicular eruption. Lower Lip-Normal: no dryness, cracking, pallor, cyanosis or vesicular eruption. Buccal Mucosa- Bilateral- No Aphthous ulcers. Oropharynx- No Discharge or Erythema. Tonsils: Characteristics- Bilateral- No Erythema or Congestion. Size/Enlargement- Bilateral- No enlargement. Discharge- bilateral-None.    Chest and Lung Exam Auscultation: Breath Sounds:- even and unlabored.  Cardiovascular Auscultation:Rythm- Regular, rate and rhythm. Murmurs & Other Heart Sounds:Ausculatation of the heart reveal- No Murmurs.  Lymphatic Head & Neck General Head & Neck Lymphatics: Bilateral: Description- No Localized lymphadenopathy.       Assessment & Plan:

## 2014-05-31 NOTE — Progress Notes (Signed)
Pre visit review using our clinic review tool, if applicable. No additional management support is needed unless otherwise documented below in the visit note. 

## 2014-10-07 ENCOUNTER — Telehealth: Payer: Self-pay | Admitting: Internal Medicine

## 2014-10-07 DIAGNOSIS — M25562 Pain in left knee: Secondary | ICD-10-CM

## 2014-10-07 DIAGNOSIS — D492 Neoplasm of unspecified behavior of bone, soft tissue, and skin: Secondary | ICD-10-CM

## 2014-10-07 NOTE — Telephone Encounter (Signed)
Okay, but I need a diagnosis for the ortho referral, where is he having pain? Thank you.

## 2014-10-07 NOTE — Telephone Encounter (Signed)
Relation to pt: self  Call back number: [phone]   Reason for call:  Patient requesting a referral to Dr. supple orthopedic telephone [phone] [phone]. Dr. Onnie Graham advised patient can be seen today as soon as referral is placed by PCP   Patient requesting a referral to Dr. Daisey Must. Whitworth, MD dermatolgist due to growth on shin.

## 2014-10-07 NOTE — Telephone Encounter (Signed)
Left knee pain  

## 2014-10-07 NOTE — Telephone Encounter (Signed)
Referrals placed to ortho-Dr. Onnie Graham for left knee pain, and derma-Dr. Elvera Lennox for skin growth on shin.

## 2014-10-08 ENCOUNTER — Telehealth: Payer: Self-pay | Admitting: Internal Medicine

## 2014-10-08 NOTE — Telephone Encounter (Signed)
Caller name: Jinny Blossom with [facility]  Reason for call: Pt has appt 10/11/14 and has [employer]. They need the auth for appt to be sent to attn: Caren Griffins fax # [phone].

## 2014-10-08 NOTE — Telephone Encounter (Signed)
Insurance auth # [id number] faxed

## 2015-05-06 ENCOUNTER — Encounter: Payer: Self-pay | Admitting: Internal Medicine

## 2015-05-06 ENCOUNTER — Ambulatory Visit (INDEPENDENT_AMBULATORY_CARE_PROVIDER_SITE_OTHER): Payer: BLUE CROSS/BLUE SHIELD | Admitting: Internal Medicine

## 2015-05-06 VITALS — BP 108/66 | HR 66 | Temp 97.8°F | Ht 68.0 in | Wt 176.5 lb

## 2015-05-06 DIAGNOSIS — Z23 Encounter for immunization: Secondary | ICD-10-CM

## 2015-05-06 DIAGNOSIS — Z09 Encounter for follow-up examination after completed treatment for conditions other than malignant neoplasm: Secondary | ICD-10-CM

## 2015-05-06 DIAGNOSIS — Z114 Encounter for screening for human immunodeficiency virus [HIV]: Secondary | ICD-10-CM

## 2015-05-06 DIAGNOSIS — Z0000 Encounter for general adult medical examination without abnormal findings: Secondary | ICD-10-CM | POA: Diagnosis not present

## 2015-05-06 NOTE — Progress Notes (Signed)
Subjective:    Patient ID: William Waters, male    DOB: 04-Mar-1976, 40 y.o.   MRN: [id number]  DOS:  05/06/2015 Type of visit - description :  CPX Interval history: no concerns   Review of Systems  Constitutional: No fever. No chills. No unexplained wt changes. No unusual sweats  HEENT: No dental problems, no ear discharge, no facial swelling, no voice changes. No eye discharge, no eye  redness , no  intolerance to light   Respiratory: No wheezing , no  difficulty breathing. No cough , no mucus production  Cardiovascular: No CP, no leg swelling , no  Palpitations  GI: no nausea, no vomiting, no diarrhea , no  abdominal pain.  No blood in the stools. No dysphagia, no odynophagia    Endocrine: No polyphagia, no polyuria , no polydipsia  GU: No dysuria, gross hematuria, difficulty urinating. No urinary urgency, no frequency.  Musculoskeletal: No joint swellings or unusual aches or pains  Skin: No change in the color of the skin, palor , no  Rash  Allergic, immunologic: No environmental allergies , no  food allergies  Neurological: No dizziness no  syncope. No headaches. No diplopia, no slurred, no slurred speech, no motor deficits, no facial  Numbness  Hematological: No enlarged lymph nodes, no easy bruising , no unusual bleedings  Psychiatry: No suicidal ideas, no hallucinations, no beavior problems, no confusion.  No unusual/severe anxiety, no depression    Past Medical History  Diagnosis Date  . GERD (gastroesophageal reflux disease)     diet controlled   . Hyperlipidemia   . Melanocytic nevus of trunk 2016    Compound Lentiginous type, on left mid medial chest    Past Surgical History  Procedure Laterality Date  . Tonsillectomy and adenoidectomy  1980s  . Lasik    . Skin lesion excision      cyst, R hip  . Vasectomy  2016    Dr Amalia Hailey    Social History   Social History  . Marital Status: Married    Spouse Name: N/A  . Number of Children: 3  . Years of  Education: N/A   Occupational History  . busines owner     Social History Main Topics  . Smoking status: Never Smoker   . Smokeless tobacco: Never Used  . Alcohol Use: 2.4 oz/week    4 Cans of beer per week     Comment: socially   . Drug Use: No  . Sexual Activity: Not on file   Other Topics Concern  . Not on file   Social History Narrative   Household-- wife and 3 children last born 02-2014     Family History  Problem Relation Age of Onset  . Prostate cancer Neg Hx   . Colon cancer Neg Hx   . Hyperlipidemia Father     F and M  . CAD Neg Hx   . Diabetes Neg Hx   . Breast cancer Other     GF       Medication List       This list is accurate as of: 05/06/15 11:59 PM.  Always use your most recent med list.               azithromycin 250 MG tablet  Commonly known as:  ZITHROMAX  Take 2 tablets by mouth on day 1, followed by 1 tablet by mouth daily for 4 days.  Objective:   Physical Exam BP 108/66 mmHg  Pulse 66  Temp(Src) 97.8 F (36.6 C) (Oral)  Ht 5\' 8"  (1.727 m)  Wt 176 lb 8 oz (80.06 kg)  BMI 26.84 kg/m2  SpO2 99% General:   Well developed, well nourished . NAD.  HEENT:  Normocephalic . Face symmetric, atraumatic Neck: No thyromegaly Lungs:  CTA B Normal respiratory effort, no intercostal retractions, no accessory muscle use. Heart: RRR,  no murmur.  no pretibial edema bilaterally  Abdomen:  Not distended, soft, non-tender. No rebound or rigidity. No mass,organomegaly Skin: Not pale. Not jaundice Neurologic:  alert & oriented X3.  Speech normal, gait appropriate for age and unassisted Psych--  Cognition and judgment appear intact.  Cooperative with normal attention span and concentration.  Behavior appropriate. No anxious or depressed appearing.     Assessment & Plan:   Assessment Hyperlipidemia GERD Compound Lentiginous type, on left mid medial chest  Plan: Hyperlipidemia: Check an MRI instead of FLP. Patient  request. RTC one year

## 2015-05-06 NOTE — Progress Notes (Signed)
Pre visit review using our clinic review tool, if applicable. No additional management support is needed unless otherwise documented below in the visit note. 

## 2015-05-06 NOTE — Assessment & Plan Note (Addendum)
Td 2013 Flu shot today Continue with his healthy lifestyle Today the patient reports a correction on his family history, no CAD. Last year the LDL particle size was 1201, ideal is less than 1000  Labs: CMP, NMR, CBC, HIV RTC 1 year

## 2015-05-06 NOTE — Patient Instructions (Signed)
GO TO THE LAB AT [facility] [place]  Schedule a complete physical exam to be done in 1 year  Please be fasting   Go to the  [facility] at your earliest convenience, fasting

## 2015-05-07 DIAGNOSIS — Z09 Encounter for follow-up examination after completed treatment for conditions other than malignant neoplasm: Secondary | ICD-10-CM | POA: Insufficient documentation

## 2015-05-07 NOTE — Assessment & Plan Note (Signed)
Hyperlipidemia: Check an MRI instead of FLP. Patient request. RTC one year

## 2015-08-12 ENCOUNTER — Other Ambulatory Visit: Payer: Self-pay | Admitting: Family Medicine

## 2015-08-12 ENCOUNTER — Encounter: Payer: Self-pay | Admitting: Internal Medicine

## 2015-08-12 DIAGNOSIS — K625 Hemorrhage of anus and rectum: Secondary | ICD-10-CM

## 2015-08-13 ENCOUNTER — Encounter: Payer: Self-pay | Admitting: Internal Medicine

## 2015-08-13 ENCOUNTER — Encounter: Payer: Self-pay | Admitting: Gastroenterology

## 2015-08-13 ENCOUNTER — Ambulatory Visit (INDEPENDENT_AMBULATORY_CARE_PROVIDER_SITE_OTHER): Payer: BLUE CROSS/BLUE SHIELD | Admitting: Internal Medicine

## 2015-08-13 VITALS — BP 112/74 | HR 58 | Temp 97.8°F | Ht 68.0 in | Wt 181.2 lb

## 2015-08-13 DIAGNOSIS — K625 Hemorrhage of anus and rectum: Secondary | ICD-10-CM | POA: Insufficient documentation

## 2015-08-13 LAB — CBC WITH DIFFERENTIAL/PLATELET
BASOS PCT: 0.5 % (ref 0.0–3.0)
Basophils Absolute: 0 10*3/uL (ref 0.0–0.1)
EOS PCT: 0.9 % (ref 0.0–5.0)
Eosinophils Absolute: 0.1 10*3/uL (ref 0.0–0.7)
HEMATOCRIT: 39.7 % (ref 39.0–52.0)
HEMOGLOBIN: 13.7 g/dL (ref 13.0–17.0)
LYMPHS PCT: 28.9 % (ref 12.0–46.0)
Lymphs Abs: 1.9 10*3/uL (ref 0.7–4.0)
MCHC: 34.5 g/dL (ref 30.0–36.0)
MCV: 84.3 fl (ref 78.0–100.0)
Monocytes Absolute: 0.5 10*3/uL (ref 0.1–1.0)
Monocytes Relative: 7.6 % (ref 3.0–12.0)
Neutro Abs: 4 10*3/uL (ref 1.4–7.7)
Neutrophils Relative %: 62.1 % (ref 43.0–77.0)
Platelets: 235 10*3/uL (ref 150.0–400.0)
RBC: 4.71 Mil/uL (ref 4.22–5.81)
RDW: 12.6 % (ref 11.5–15.5)
WBC: 6.4 10*3/uL (ref 4.0–10.5)

## 2015-08-13 NOTE — Progress Notes (Signed)
Pre visit review using our clinic review tool, if applicable. No additional management support is needed unless otherwise documented below in the visit note. 

## 2015-08-13 NOTE — Progress Notes (Signed)
   Subjective:    Patient ID: William Waters, male    DOB: 08/17/1975, 40 y.o.   MRN: [id number]  DOS:  08/13/2015 Type of visit - description : Acute visit Interval history: 3 days ago, he had a sudden need of having a bowel movement, had a very loose/diarrhea type of stool and at the same time saw abundant amount of red blood in the toilet bowl and running down to his leg. Since then no further episodes.   Review of Systems  Denies rectal pain or itching No nausea, vomiting, stomach pain No recent NSAIDs No unexplained weight loss. Past Medical History  Diagnosis Date  . GERD (gastroesophageal reflux disease)     diet controlled   . Hyperlipidemia   . Melanocytic nevus of trunk 2016    Compound Lentiginous type, on left mid medial chest    Past Surgical History  Procedure Laterality Date  . Tonsillectomy and adenoidectomy  1980s  . Lasik    . Skin lesion excision      cyst, R hip  . Vasectomy  2016    Dr Amalia Hailey    Social History   Social History  . Marital Status: Married    Spouse Name: N/A  . Number of Children: 3  . Years of Education: N/A   Occupational History  . busines owner     Social History Main Topics  . Smoking status: Never Smoker   . Smokeless tobacco: Never Used  . Alcohol Use: 2.4 oz/week    4 Cans of beer per week     Comment: socially   . Drug Use: No  . Sexual Activity: Not on file   Other Topics Concern  . Not on file   Social History Narrative   Household-- wife and 3 children last born 02-2014        Medication List    Notice  As of 08/13/2015  3:35 PM   You have not been prescribed any medications.         Objective:   Physical Exam BP 112/74 mmHg  Pulse 58  Temp(Src) 97.8 F (36.6 C) (Oral)  Ht 5\' 8"  (1.727 m)  Wt 181 lb 4 oz (82.214 kg)  BMI 27.57 kg/m2  SpO2 97% General:   Well developed, well nourished . NAD.  HEENT:  Normocephalic . Face symmetric, atraumatic Lungs:  CTA B Normal respiratory effort, no  intercostal retractions, no accessory muscle use. Heart: RRR,  no murmur.  no pretibial edema bilaterally  Abdomen:  Not distended, soft, non-tender. No rebound or rigidity.  DRE/anoscopy: Declined Skin: Not pale. Not jaundice Neurologic:  alert & oriented X3.  Speech normal, gait appropriate for age and unassisted Psych--  Cognition and judgment appear intact.  Cooperative with normal attention span and concentration.  Behavior appropriate. No anxious or depressed appearing.    Assessment & Plan:   Assessment Hyperlipidemia GERD Compound Lentiginous type, on left mid medial chest  Plan: Rectal bleeding: Red blood per rectum as described above, likely a very distal issue. Years ago, he was seen by me with a similar episode, refered to GI, they rx a colonoscopy but that never happened. Declined DRE/anoscopy today Plan: He has been already referred to GI has an appointment pending in 2-3 weeks, check a CBC, if symptoms resurface patient to let me know.

## 2015-08-13 NOTE — Assessment & Plan Note (Signed)
Rectal bleeding: Red blood per rectum as described above, likely a very distal issue. Years ago, he was seen by me with a similar episode, refered to GI, they rx a colonoscopy but that never happened. Declined DRE/anoscopy today Plan: He has been already referred to GI has an appointment pending in 2-3 weeks, check a CBC, if symptoms resurface patient to let me know.

## 2015-08-13 NOTE — Patient Instructions (Signed)
Please go to the lab

## 2015-09-03 ENCOUNTER — Encounter: Payer: Self-pay | Admitting: Gastroenterology

## 2015-09-03 ENCOUNTER — Ambulatory Visit (INDEPENDENT_AMBULATORY_CARE_PROVIDER_SITE_OTHER): Payer: BLUE CROSS/BLUE SHIELD | Admitting: Gastroenterology

## 2015-09-03 VITALS — BP 114/80 | HR 100 | Ht 68.0 in | Wt 170.0 lb

## 2015-09-03 DIAGNOSIS — K625 Hemorrhage of anus and rectum: Secondary | ICD-10-CM

## 2015-09-03 MED ORDER — NA SULFATE-K SULFATE-MG SULF 17.5-3.13-1.6 GM/177ML PO SOLN: 1.0000 | Freq: Once | ORAL | Status: DC

## 2015-09-03 NOTE — Progress Notes (Signed)
     09/03/2015 William Waters [id number] [birth year]   HISTORY OF PRESENT ILLNESS:  This is a pleasant 40 year old male who is new to our practice and was referred here by his PCP, Dr. Larose Kells, for evaluation of rectal bleeding. The patient tells me that he had one episode of a large amount of red blood in his stool. This occurred a few weeks ago as a single episode and has not been recurrent since that time. He denies constipation or straining at that time and says that he actually thinks that it was a loose stool that occurred with the bleeding. He does report similar episodes in the past over the last several years. According to Dr. Ethel Rana note he was seen by GI in the past several years ago and they recommended a colonoscopy, but he never had that performed. The patient denies any other associated symptoms with his bleeding. Loose stools are not regular occurrence for him; he says that he moves his bowels normally for the most part. Recent CBC was normal.   Past Medical History  Diagnosis Date  . GERD (gastroesophageal reflux disease)     diet controlled   . Hyperlipidemia   . Melanocytic nevus of trunk 2016    Compound Lentiginous type, on left mid medial chest   Past Surgical History  Procedure Laterality Date  . Tonsillectomy and adenoidectomy  1980s  . Lasik    . Skin lesion excision      cyst, R hip  . Vasectomy  2016    Dr Amalia Hailey    reports that he has never smoked. He has never used smokeless tobacco. He reports that he drinks about 2.4 oz of alcohol per week. He reports that he does not use illicit drugs. family history includes Breast cancer in his maternal grandfather; Colitis in his father; Hyperlipidemia in his father and mother; Hypertension in his father. Allergies  Allergen Reactions  . Penicillins       Outpatient Encounter Prescriptions as of 09/03/2015  Medication Sig  . Na Sulfate-K Sulfate-Mg Sulf 17.5-3.13-1.6 GM/180ML SOLN Take 1 kit by mouth once.   No  facility-administered encounter medications on file as of 09/03/2015.     REVIEW OF SYSTEMS  : All other systems reviewed and negative except where noted in the History of Present Illness.   PHYSICAL EXAM: BP 114/80 mmHg  Pulse 100  Ht '5\' 8"'$  (1.727 m)  Wt 170 lb (77.111 kg)  BMI 25.85 kg/m2 General: Well developed white male in no acute distress Head: Normocephalic and atraumatic Eyes:  Sclerae anicteric, conjunctiva pink. Ears: Normal auditory acuity Lungs: Clear throughout to auscultation Heart: Regular rate and rhythm Abdomen: Soft, non-distended.  Normal bowel sounds.  Non-tender. Rectal:  Will be done at the time of colonoscopy. Musculoskeletal: Symmetrical with no gross deformities  Skin: No lesions on visible extremities Extremities: No edema  Neurological: Alert oriented x 4, grossly non-focal Psychological:  Alert and cooperative. Normal mood and affect  ASSESSMENT AND PLAN: -Rectal bleeding:  One episode a few weeks ago, but has had similar episodes in the past. No other associated symptoms. Likely outlet bleeding from hemorrhoids, but due to recurrent episodes we will schedule for colonoscopy with Dr. Fuller Plan.  The risks, benefits, and alternatives to colonoscopy were discussed with the patient and he consents to proceed.  Hgb normal.   CC:  Mosie Lukes, MD

## 2015-09-03 NOTE — Patient Instructions (Signed)
You have been scheduled for a colonoscopy. Please follow written instructions given to you at your visit today.  Please pick up your prep supplies at the pharmacy , [facility] [place].  If you use inhalers (even only as needed), please bring them with you on the day of your procedure. Your physician has requested that you go to www.startemmi.com and enter the access code given to you at your visit today. This web site gives a general overview about your procedure. However, you should still follow specific instructions given to you by our office regarding your preparation for the procedure.

## 2015-09-03 NOTE — Progress Notes (Signed)
Reviewed and agree with management plan.  Malcolm T. Stark, MD FACG 

## 2015-09-05 ENCOUNTER — Ambulatory Visit (AMBULATORY_SURGERY_CENTER): Payer: BLUE CROSS/BLUE SHIELD | Admitting: Gastroenterology

## 2015-09-05 ENCOUNTER — Encounter: Payer: Self-pay | Admitting: Gastroenterology

## 2015-09-05 VITALS — BP 118/70 | HR 52 | Temp 98.9°F | Resp 14 | Ht 68.0 in | Wt 170.0 lb

## 2015-09-05 DIAGNOSIS — K921 Melena: Secondary | ICD-10-CM | POA: Diagnosis not present

## 2015-09-05 MED ORDER — SODIUM CHLORIDE 0.9 % IV SOLN: 500.0000 mL | INTRAVENOUS | Status: DC

## 2015-09-05 NOTE — Patient Instructions (Signed)
YOU HAD AN ENDOSCOPIC PROCEDURE TODAY AT [facility]:   Refer to the procedure report that was given to you for any specific questions about what was found during the examination.  If the procedure report does not answer your questions, please call your gastroenterologist to clarify.  If you requested that your care partner not be given the details of your procedure findings, then the procedure report has been included in a sealed envelope for you to review at your convenience later.  YOU SHOULD EXPECT: Some feelings of bloating in the abdomen. Passage of more gas than usual.  Walking can help get rid of the air that was put into your GI tract during the procedure and reduce the bloating. If you had a lower endoscopy (such as a colonoscopy or flexible sigmoidoscopy) you may notice spotting of blood in your stool or on the toilet paper. If you underwent a bowel prep for your procedure, you may not have a normal bowel movement for a few days.  Please Note:  You might notice some irritation and congestion in your nose or some drainage.  This is from the oxygen used during your procedure.  There is no need for concern and it should clear up in a day or so.  SYMPTOMS TO REPORT IMMEDIATELY:   Following lower endoscopy (colonoscopy or flexible sigmoidoscopy):  Excessive amounts of blood in the stool  Significant tenderness or worsening of abdominal pains  Swelling of the abdomen that is new, acute  Fever of 100F or higher   For urgent or emergent issues, a gastroenterologist can be reached at any hour by calling [phone].   DIET: Your first meal following the procedure should be a small meal and then it is ok to progress to your normal diet. Heavy or fried foods are harder to digest and may make you feel nauseous or bloated.  Likewise, meals heavy in dairy and vegetables can increase bloating.  Drink plenty of fluids but you should avoid alcoholic beverages for 24  hours.  ACTIVITY:  You should plan to take it easy for the rest of today and you should NOT DRIVE or use heavy machinery until tomorrow (because of the sedation medicines used during the test).    FOLLOW UP: Our staff will call the number listed on your records the next business day following your procedure to check on you and address any questions or concerns that you may have regarding the information given to you following your procedure. If we do not reach you, we will leave a message.  However, if you are feeling well and you are not experiencing any problems, there is no need to return our call.  We will assume that you have returned to your regular daily activities without incident.  If any biopsies were taken you will be contacted by phone or by letter within the next 1-3 weeks.  Please call us at [phone] if you have not heard about the biopsies in 3 weeks.    SIGNATURES/CONFIDENTIALITY: You and/or your care partner have signed paperwork which will be entered into your electronic medical record.  These signatures attest to the fact that that the information above on your After Visit Summary has been reviewed and is understood.  Full responsibility of the confidentiality of this discharge information lies with you and/or your care-partner.  Read at the handouts given to you by your recovery room nurse.  Thank-you for choosing [facility] for your healthcare needs today.

## 2015-09-05 NOTE — Progress Notes (Signed)
Report to PACU, RN, vss, BBS= Clear.  

## 2015-09-05 NOTE — Op Note (Signed)
[facility] Patient Name: [facility] Procedure Date: 09/05/2015 1:46 PM MRN: [id number] Endoscopist: Ladene Artist , MD Age: 40 Referring MD:  Date of Birth: 04-29-1975 Gender: Male Account #: [id number] Procedure:                Colonoscopy Indications:              Evaluation of unexplained GI bleeding, Hematochezia Medicines:                Monitored Anesthesia Care Procedure:                Pre-Anesthesia Assessment:                           - Prior to the procedure, a History and Physical                            was performed, and patient medications and                            allergies were reviewed. The patient's tolerance of                            previous anesthesia was also reviewed. The risks                            and benefits of the procedure and the sedation                            options and risks were discussed with the patient.                            All questions were answered, and informed consent                            was obtained. Prior Anticoagulants: The patient has                            taken no previous anticoagulant or antiplatelet                            agents. ASA Grade Assessment: II - A patient with                            mild systemic disease. After reviewing the risks                            and benefits, the patient was deemed in                            satisfactory condition to undergo the procedure.                           After obtaining informed consent, the colonoscope  was passed under direct vision. Throughout the                            procedure, the patient's blood pressure, pulse, and                            oxygen saturations were monitored continuously. The                            Model PCF-H190L [id number]) scope was introduced                            through the anus and advanced to the the cecum,                            identified by  appendiceal orifice and ileocecal                            valve. The ileocecal valve, appendiceal orifice,                            and rectum were photographed. The quality of the                            bowel preparation was good. The colonoscopy was                            performed without difficulty. The patient tolerated                            the procedure well. Scope In: 1:53:12 PM Scope Out: [id number] PM Scope Withdrawal Time: 0 hours 11 minutes 27 seconds  Total Procedure Duration: 0 hours 13 minutes 7 seconds  Findings:                 Internal hemorrhoids were found during                            retroflexion. The hemorrhoids were small and Grade                            I (internal hemorrhoids that do not prolapse).                           The exam was otherwise without abnormality on                            direct and retroflexion views. Complications:            No immediate complications. Estimated blood loss:                            None. Estimated Blood Loss:     Estimated blood loss: none. Impression:               - Internal hemorrhoids.                           -  The examination was otherwise normal on direct                            and retroflexion views.                           - No specimens collected. Recommendation:           - Repeat colonoscopy in 10 years for screening                            purposes.                           - Patient has a contact number available for                            emergencies. The signs and symptoms of potential                            delayed complications were discussed with the                            patient. Return to normal activities tomorrow.                            Written discharge instructions were provided to the                            patient.                           - Resume previous diet.                           - Continue present medications. Ladene Artist, MD 09/05/2015 2:09:05 PM This report has been signed electronically.

## 2015-09-08 ENCOUNTER — Telehealth: Payer: Self-pay

## 2015-09-08 NOTE — Telephone Encounter (Signed)
  Follow up Call-  Call back number 09/05/2015  Post procedure Call Back phone  # [phone]  Permission to leave phone message Yes     Patient questions:  Do you have a fever, pain , or abdominal swelling? No. Pain Score  0 *  Have you tolerated food without any problems? Yes.    Have you been able to return to your normal activities? Yes.    Do you have any questions about your discharge instructions: Diet   No. Medications  No. Follow up visit  No.  Do you have questions or concerns about your Care? No.  Actions: * If pain score is 4 or above: No action needed, pain <4.

## 2015-09-30 ENCOUNTER — Encounter: Payer: Self-pay | Admitting: Internal Medicine

## 2015-09-30 ENCOUNTER — Other Ambulatory Visit: Payer: Self-pay | Admitting: Internal Medicine

## 2015-09-30 ENCOUNTER — Ambulatory Visit (INDEPENDENT_AMBULATORY_CARE_PROVIDER_SITE_OTHER): Payer: BLUE CROSS/BLUE SHIELD | Admitting: Internal Medicine

## 2015-09-30 VITALS — BP 108/66 | HR 60 | Temp 97.5°F | Ht 68.0 in | Wt 177.4 lb

## 2015-09-30 DIAGNOSIS — R1032 Left lower quadrant pain: Secondary | ICD-10-CM

## 2015-09-30 DIAGNOSIS — N451 Epididymitis: Secondary | ICD-10-CM

## 2015-09-30 LAB — POCT URINALYSIS DIPSTICK
Bilirubin, UA: NEGATIVE
GLUCOSE UA: NEGATIVE
KETONES UA: NEGATIVE
Leukocytes, UA: NEGATIVE
Nitrite, UA: NEGATIVE
PROTEIN UA: NEGATIVE
RBC UA: NEGATIVE
SPEC GRAV UA: 1.01
UROBILINOGEN UA: 0.2
pH, UA: 7

## 2015-09-30 MED ORDER — OFLOXACIN 300 MG PO TABS: 300.0000 mg | ORAL_TABLET | Freq: Two times a day (BID) | ORAL | Status: DC

## 2015-09-30 NOTE — Progress Notes (Signed)
Pre visit review using our clinic review tool, if applicable. No additional management support is needed unless otherwise documented below in the visit note. 

## 2015-09-30 NOTE — Patient Instructions (Signed)
Take the antibiotic twice a day for 10 days  Drink plenty of fluids  Tylenol or Motrin as needed for pain  Will schedule a ultrasound  If you are not gradually improving over the next 2 days let us know, also call the office if you have severe symptoms

## 2015-09-30 NOTE — Progress Notes (Signed)
   Subjective:    Patient ID: [facility], male    DOB: 1975/03/27, 40 y.o.   MRN: [id number]  DOS:  09/30/2015 Type of visit - description : Acute visit Interval history: 3 days ago noted some glands swollen at the left groin and also mild/ill-defined left the scrotum pain. No symptoms on the right side. Resembles his previous episode of  epididymitis.Has not taken any medication for it.   Review of Systems  No fever chills No nausea or vomiting No penile discharge, rash or ulcer No dysuria, gross hematuria difficulty urinating. No high risk sexual behavior.  Past Medical History  Diagnosis Date  . GERD (gastroesophageal reflux disease)     diet controlled   . Hyperlipidemia   . Melanocytic nevus of trunk 2016    Compound Lentiginous type, on left mid medial chest    Past Surgical History  Procedure Laterality Date  . Tonsillectomy and adenoidectomy  1980s  . Lasik    . Skin lesion excision      cyst, R hip  . Vasectomy  2016    Dr Amalia Hailey    Social History   Social History  . Marital Status: Married    Spouse Name: N/A  . Number of Children: 3  . Years of Education: N/A   Occupational History  . business owner     Social History Main Topics  . Smoking status: Never Smoker   . Smokeless tobacco: Never Used  . Alcohol Use: 2.4 oz/week    4 Cans of beer per week     Comment: socially   . Drug Use: No  . Sexual Activity: Not on file   Other Topics Concern  . Not on file   Social History Narrative   Household-- wife and 3 children last born 02-2014        Medication List    Notice  As of 09/30/2015  3:25 PM   You have not been prescribed any medications.         Objective:   Physical Exam BP 108/66 mmHg  Pulse 60  Temp(Src) 97.5 F (36.4 C) (Oral)  Ht 5\' 8"  (1.727 m)  Wt 177 lb 6 oz (80.457 kg)  BMI 26.98 kg/m2  SpO2 98% General:   Well developed, well nourished . NAD.  HEENT:  Normocephalic . Face symmetric, atraumatic  Skin: Not  pale. Not jaundice. Genital area is shaved but no folliculitis GU: Penis without lesions or discharge Right testicle normal Left testicle: Epididymitis slightly swollen, moderately TTP distally > proximally . Groin: Few 1 cm LADs, mobile, slightly tender; symmetric  lymph nodes on the right are smaller and not tender. Neurologic:  alert & oriented X3.  Speech normal, gait appropriate for age and unassisted Psych--  Cognition and judgment appear intact.  Cooperative with normal attention span and concentration.  Behavior appropriate. No anxious or depressed appearing.    Assessment & Plan:   Assessment Hyperlipidemia GERD Compound Lentiginous type, on left mid medial chest   Plan:  Epididymitis:  Suspect epididymitis on clinical grounds, pt had similar symptoms before, Udip (-) Plan: UA, urine culture, G&C. Start ofloxacin. For completeness, Rx a ultrasound. Knows to call if not gradually improving. See AVS.

## 2015-10-01 ENCOUNTER — Ambulatory Visit (HOSPITAL_BASED_OUTPATIENT_CLINIC_OR_DEPARTMENT_OTHER)
Admission: RE | Admit: 2015-10-01 | Discharge: 2015-10-01 | Disposition: A | Discharge status: Home / Self Care | Payer: BLUE CROSS/BLUE SHIELD | Source: Ambulatory Visit | Attending: Internal Medicine | Admitting: Internal Medicine

## 2015-10-01 DIAGNOSIS — N433 Hydrocele, unspecified: Secondary | ICD-10-CM | POA: Diagnosis not present

## 2015-10-01 DIAGNOSIS — R1032 Left lower quadrant pain: Secondary | ICD-10-CM | POA: Insufficient documentation

## 2015-10-01 DIAGNOSIS — I861 Scrotal varices: Secondary | ICD-10-CM | POA: Diagnosis not present

## 2015-10-01 LAB — GC/CHLAMYDIA PROBE AMP
CT Probe RNA: NOT DETECTED
GC Probe RNA: NOT DETECTED

## 2015-10-01 LAB — URINALYSIS, ROUTINE W REFLEX MICROSCOPIC
BILIRUBIN URINE: NEGATIVE
HGB URINE DIPSTICK: NEGATIVE
Ketones, ur: NEGATIVE
LEUKOCYTES UA: NEGATIVE
NITRITE: NEGATIVE
RBC / HPF: NONE SEEN (ref 0–?)
Specific Gravity, Urine: <=1.005 — AB (ref 1.000–1.030)
TOTAL PROTEIN, URINE-UPE24: NEGATIVE
URINE GLUCOSE: NEGATIVE
UROBILINOGEN UA: 0.2 (ref 0.0–1.0)
WBC, UA: NONE SEEN (ref 0–?)
pH: 7 (ref 5.0–8.0)

## 2015-10-01 NOTE — Assessment & Plan Note (Signed)
Epididymitis:  Suspect epididymitis on clinical grounds, pt had similar symptoms before, Udip (-) Plan: UA, urine culture, G&C. Start ofloxacin. For completeness, Rx a ultrasound. Knows to call if not gradually improving. See AVS.

## 2015-10-02 LAB — URINE CULTURE: ORGANISM ID, BACTERIA: NO GROWTH

## 2015-10-18 ENCOUNTER — Emergency Department (HOSPITAL_COMMUNITY)
Admission: EM | Admit: 2015-10-18 | Discharge: 2015-10-18 | Disposition: A | Discharge status: Home / Self Care | Payer: BLUE CROSS/BLUE SHIELD | Source: Home / Self Care | Attending: Emergency Medicine | Admitting: Emergency Medicine

## 2015-10-18 ENCOUNTER — Encounter (HOSPITAL_COMMUNITY): Payer: Self-pay

## 2015-10-18 DIAGNOSIS — E785 Hyperlipidemia, unspecified: Secondary | ICD-10-CM | POA: Diagnosis present

## 2015-10-18 DIAGNOSIS — L03211 Cellulitis of face: Principal | ICD-10-CM | POA: Diagnosis present

## 2015-10-18 DIAGNOSIS — K219 Gastro-esophageal reflux disease without esophagitis: Secondary | ICD-10-CM | POA: Diagnosis present

## 2015-10-18 MED ORDER — SULFAMETHOXAZOLE-TRIMETHOPRIM 800-160 MG PO TABS: 1.0000 | ORAL_TABLET | Freq: Two times a day (BID) | ORAL | 0 refills | Status: DC

## 2015-10-18 MED ORDER — ACYCLOVIR 200 MG PO CAPS: 200.0000 mg | ORAL_CAPSULE | Freq: Every day | ORAL | 0 refills | Status: DC

## 2015-10-18 MED ORDER — SULFAMETHOXAZOLE-TRIMETHOPRIM 800-160 MG PO TABS
1.0000 | ORAL_TABLET | Freq: Once | ORAL | Status: AC
  Administered 2015-10-18: 1 via ORAL
  Filled 2015-10-18: qty 1

## 2015-10-18 MED ORDER — ACYCLOVIR 200 MG PO CAPS
200.0000 mg | ORAL_CAPSULE | Freq: Once | ORAL | Status: AC
  Administered 2015-10-18: 200 mg via ORAL
  Filled 2015-10-18: qty 1

## 2015-10-18 MED ORDER — HYDROCODONE-ACETAMINOPHEN 5-325 MG PO TABS: 1.0000 | ORAL_TABLET | Freq: Four times a day (QID) | ORAL | 0 refills | Status: DC | PRN

## 2015-10-18 MED ORDER — OXYCODONE-ACETAMINOPHEN 5-325 MG PO TABS
1.0000 | ORAL_TABLET | Freq: Once | ORAL | Status: AC
  Administered 2015-10-18: 1 via ORAL
  Filled 2015-10-18: qty 1
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
## 2015-10-18 NOTE — ED Notes (Signed)
PA at bedside.
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
## 2015-10-18 NOTE — Discharge Instructions (Signed)
Please use antibiotics and antivirals as directed. Please follow-up in 2 days for reevaluation. If any new or worsening signs or symptoms present please return immediately for further evaluation and management.

## 2015-10-19 ENCOUNTER — Inpatient Hospital Stay (HOSPITAL_COMMUNITY)
Admission: EM | Admit: 2015-10-19 | Discharge: 2015-10-21 | DRG: 603 | Disposition: A | Discharge status: Home / Self Care | Payer: BLUE CROSS/BLUE SHIELD | Attending: Internal Medicine | Admitting: Internal Medicine

## 2015-10-19 ENCOUNTER — Encounter (HOSPITAL_COMMUNITY): Payer: Self-pay | Admitting: *Deleted

## 2015-10-19 DIAGNOSIS — L03211 Cellulitis of face: Secondary | ICD-10-CM | POA: Diagnosis not present

## 2015-10-19 DIAGNOSIS — D729 Disorder of white blood cells, unspecified: Secondary | ICD-10-CM

## 2015-10-19 LAB — CBC WITH DIFFERENTIAL/PLATELET
BASOS ABS: 0 10*3/uL (ref 0.0–0.1)
BASOS PCT: 0 %
Eosinophils Absolute: 0 10*3/uL (ref 0.0–0.7)
Eosinophils Relative: 0 %
HEMATOCRIT: 41.4 % (ref 39.0–52.0)
HEMOGLOBIN: 14.6 g/dL (ref 13.0–17.0)
LYMPHS PCT: 12 %
Lymphs Abs: 1.3 10*3/uL (ref 0.7–4.0)
MCH: 29.3 pg (ref 26.0–34.0)
MCHC: 35.3 g/dL (ref 30.0–36.0)
MCV: 83.1 fL (ref 78.0–100.0)
MONO ABS: 1 10*3/uL (ref 0.1–1.0)
MONOS PCT: 9 %
NEUTROS ABS: 8.6 10*3/uL — AB (ref 1.7–7.7)
NEUTROS PCT: 79 %
Platelets: 233 10*3/uL (ref 150–400)
RBC: 4.98 MIL/uL (ref 4.22–5.81)
RDW: 13 % (ref 11.5–15.5)
WBC: 10.9 10*3/uL — ABNORMAL HIGH (ref 4.0–10.5)

## 2015-10-19 LAB — URINALYSIS, ROUTINE W REFLEX MICROSCOPIC
Bilirubin Urine: NEGATIVE
Glucose, UA: NEGATIVE mg/dL
Hgb urine dipstick: NEGATIVE
Ketones, ur: NEGATIVE mg/dL
Leukocytes, UA: NEGATIVE
Nitrite: NEGATIVE
Protein, ur: NEGATIVE mg/dL
Specific Gravity, Urine: 1.01 (ref 1.005–1.030)
pH: 7 (ref 5.0–8.0)

## 2015-10-19 LAB — BASIC METABOLIC PANEL
Anion gap: 9 (ref 5–15)
BUN: 7 mg/dL (ref 6–20)
CO2: 26 mmol/L (ref 22–32)
Calcium: 9.5 mg/dL (ref 8.9–10.3)
Chloride: 100 mmol/L — ABNORMAL LOW (ref 101–111)
Creatinine, Ser: 1.18 mg/dL (ref 0.61–1.24)
GFR calc Af Amer: >60 mL/min (ref >60)
GFR calc non Af Amer: >60 mL/min (ref >60)
Glucose, Bld: 127 mg/dL — ABNORMAL HIGH (ref 65–99)
Potassium: 4.3 mmol/L (ref 3.5–5.1)
Sodium: 135 mmol/L (ref 135–145)

## 2015-10-19 LAB — I-STAT CG4 LACTIC ACID, ED: Lactic Acid, Venous: 1.18 mmol/L (ref 0.5–1.9)

## 2015-10-19 LAB — MRSA PCR SCREENING: MRSA by PCR: NEGATIVE

## 2015-10-19 MED ORDER — ONDANSETRON HCL 4 MG/2ML IJ SOLN: 4.0000 mg | Freq: Four times a day (QID) | INTRAMUSCULAR | Status: DC | PRN

## 2015-10-19 MED ORDER — SODIUM CHLORIDE 0.9 % IV SOLN
INTRAVENOUS | Status: AC
  Administered 2015-10-19: 12:00:00 via INTRAVENOUS

## 2015-10-19 MED ORDER — ENOXAPARIN SODIUM 40 MG/0.4ML ~~LOC~~ SOLN
40.0000 mg | Freq: Every day | SUBCUTANEOUS | Status: DC
  Administered 2015-10-20: 40 mg via SUBCUTANEOUS
  Filled 2015-10-19 (×3): qty 0.4

## 2015-10-19 MED ORDER — IBUPROFEN 200 MG PO TABS: 600.0000 mg | ORAL_TABLET | Freq: Four times a day (QID) | ORAL | Status: DC | PRN

## 2015-10-19 MED ORDER — CLINDAMYCIN PHOSPHATE 600 MG/50ML IV SOLN
600.0000 mg | Freq: Once | INTRAVENOUS | Status: AC
  Administered 2015-10-19: 600 mg via INTRAVENOUS
  Filled 2015-10-19: qty 50

## 2015-10-19 MED ORDER — ONDANSETRON HCL 4 MG PO TABS: 4.0000 mg | ORAL_TABLET | Freq: Four times a day (QID) | ORAL | Status: DC | PRN

## 2015-10-19 MED ORDER — HYDROCODONE-ACETAMINOPHEN 5-325 MG PO TABS
1.0000 | ORAL_TABLET | ORAL | Status: DC | PRN
  Administered 2015-10-19 – 2015-10-20 (×5): 2 via ORAL
  Filled 2015-10-19 (×5): qty 2

## 2015-10-19 MED ORDER — ACYCLOVIR 200 MG PO CAPS
400.0000 mg | ORAL_CAPSULE | Freq: Three times a day (TID) | ORAL | Status: DC
  Filled 2015-10-19: qty 2

## 2015-10-19 MED ORDER — CLINDAMYCIN PHOSPHATE 600 MG/50ML IV SOLN
600.0000 mg | Freq: Three times a day (TID) | INTRAVENOUS | Status: DC
  Administered 2015-10-19 – 2015-10-21 (×7): 600 mg via INTRAVENOUS
  Filled 2015-10-19 (×7): qty 50

## 2015-10-19 MED ORDER — ACYCLOVIR 400 MG PO TABS
400.0000 mg | ORAL_TABLET | Freq: Three times a day (TID) | ORAL | Status: DC
  Administered 2015-10-19: 400 mg via ORAL
  Filled 2015-10-19 (×2): qty 1
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
## 2015-10-19 NOTE — ED Notes (Signed)
Hospitalist at bedside 

## 2015-10-19 NOTE — ED Triage Notes (Signed)
Pt was seen 2 days ago for staph infection to the lower part of his face. Pt states he has taken antibiotics but does not feel symptoms are getting better. Pt states he had fever last night. Pt took vicodin for pain this morning.

## 2015-10-19 NOTE — ED Notes (Signed)
PA at bedside.
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
## 2015-10-20 DIAGNOSIS — K219 Gastro-esophageal reflux disease without esophagitis: Secondary | ICD-10-CM | POA: Diagnosis present

## 2015-10-20 DIAGNOSIS — L03211 Cellulitis of face: Principal | ICD-10-CM

## 2015-10-20 DIAGNOSIS — E785 Hyperlipidemia, unspecified: Secondary | ICD-10-CM | POA: Diagnosis present

## 2015-10-20 LAB — CBC
HEMATOCRIT: 37.5 % — AB (ref 39.0–52.0)
Hemoglobin: 12.9 g/dL — ABNORMAL LOW (ref 13.0–17.0)
MCH: 29.3 pg (ref 26.0–34.0)
MCHC: 34.4 g/dL (ref 30.0–36.0)
MCV: 85 fL (ref 78.0–100.0)
PLATELETS: 189 10*3/uL (ref 150–400)
RBC: 4.41 MIL/uL (ref 4.22–5.81)
RDW: 13.1 % (ref 11.5–15.5)
WBC: 8.7 10*3/uL (ref 4.0–10.5)

## 2015-10-20 LAB — URINE CULTURE: Culture: NO GROWTH

## 2015-10-20 LAB — HIV ANTIBODY (ROUTINE TESTING W REFLEX): HIV Screen 4th Generation wRfx: NONREACTIVE

## 2015-10-20 NOTE — Progress Notes (Signed)
PROGRESS NOTE    William Waters  [id number] DOB: Aug 11, 1975 DOA: 10/19/2015 PCP: Kathlene November, MD   Brief Narrative:  Mr William Waters is a pleasant 40 year old gentleman withvertigo past medical history admitted to the medicine service on 10/19/2015 when he presented with complaints of erythema and pain over mandibular region, had been treated with Bactrim in the outpatient setting. He was admitted for facial cellulitis and started on IV clindamycin.    Assessment & Plan:   Principal Problem:   Facial cellulitis  1.  Facial cellulitis -He presented to the emergency department on 10/19/2015 with complaints of pain, erythema, induration involving chin. -Initial lab work showing white blood cell count of 10.9 -On physical examination today he has induration over his chin, there was no fluctuant mass or purulence discharge noted -Plan to continue IV clindamycin for the next 24 hours, if continues to show clinical improvement will transition him to oral antibiotic therapy at that point. Otherwise he remains hemodynamically stable, had a low-grade temp overnight of 100.9.    DVT prophylaxis: Lovenox Code Status: Full code Family Communication: I spoke to his wife Disposition Plan: Anticipate discharge in the next 24 hours   Antimicrobials:   Clindamycin   Subjective: He reports ongoing pain involving chin  Objective: Vitals:   10/20/15 0500 10/20/15 0530 10/20/15 1137 10/20/15 1349  BP:  138/81  (!) 144/75  Pulse:  74  69  Resp:  16  18  Temp:  (!) 100.9 F (38.3 C) 98.1 F (36.7 C) 100.1 F (37.8 C)  TempSrc:  Oral Oral Oral  SpO2:  98%  100%  Weight: 75.2 kg (165 lb 12.6 oz)     Height:        Intake/Output Summary (Last 24 hours) at 10/20/15 1404 Last data filed at 10/20/15 1006  Gross per 24 hour  Intake           2942.5 ml  Output                0 ml  Net           2942.5 ml   Filed Weights   10/19/15 0820 10/19/15 1200 10/20/15 0500  Weight: 77.1 kg (170 lb) 76.2  kg (168 lb) 75.2 kg (165 lb 12.6 oz)    Examination:  General exam: Appears calm and comfortable  Respiratory system: Clear to auscultation. Respiratory effort normal. Cardiovascular system: S1 & S2 heard, RRR. No JVD, murmurs, rubs, gallops or clicks. No pedal edema. Gastrointestinal system: Abdomen is nondistended, soft and nontender. No organomegaly or masses felt. Normal bowel sounds heard. Central nervous system: Alert and oriented. No focal neurological deficits. Extremities: Symmetric 5 x 5 power. Skin: There is erythema and induration involving chin, serosanguineous discharge noted however no purulence Psychiatry: Judgement and insight appear normal. Mood & affect appropriate.     Data Reviewed: I have personally reviewed following labs and imaging studies  CBC:  Recent Labs Lab 10/19/15 0847 10/20/15 0445  WBC 10.9* 8.7  NEUTROABS 8.6*  --   HGB 14.6 12.9*  HCT 41.4 37.5*  MCV 83.1 85.0  PLT 233 [id number]   Basic Metabolic Panel:  Recent Labs Lab 10/19/15 0847  NA 135  K 4.3  CL 100*  CO2 26  GLUCOSE 127*  BUN 7  CREATININE 1.18  CALCIUM 9.5   GFR: Estimated Creatinine Clearance: 78.9 mL/min (by C-G formula based on SCr of 1.18 mg/dL). Liver Function Tests: No results for input(s): AST, ALT, ALKPHOS, BILITOT, PROT,  ALBUMIN in the last 168 hours. No results for input(s): LIPASE, AMYLASE in the last 168 hours. No results for input(s): AMMONIA in the last 168 hours. Coagulation Profile: No results for input(s): INR, PROTIME in the last 168 hours. Cardiac Enzymes: No results for input(s): CKTOTAL, CKMB, CKMBINDEX, TROPONINI in the last 168 hours. BNP (last 3 results) No results for input(s): PROBNP in the last 8760 hours. HbA1C: No results for input(s): HGBA1C in the last 72 hours. CBG: No results for input(s): GLUCAP in the last 168 hours. Lipid Profile: No results for input(s): CHOL, HDL, LDLCALC, TRIG, CHOLHDL, LDLDIRECT in the last 72 hours. Thyroid  Function Tests: No results for input(s): TSH, T4TOTAL, FREET4, T3FREE, THYROIDAB in the last 72 hours. Anemia Panel: No results for input(s): VITAMINB12, FOLATE, FERRITIN, TIBC, IRON, RETICCTPCT in the last 72 hours. Sepsis Labs:  Recent Labs Lab 10/19/15 0915  LATICACIDVEN 1.18    Recent Results (from the past 240 hour(s))  MRSA PCR Screening     Status: None   Collection Time: 10/19/15  2:50 PM  Result Value Ref Range Status   MRSA by PCR NEGATIVE NEGATIVE Final    Comment:        The GeneXpert MRSA Assay (FDA approved for NASAL specimens only), is one component of a comprehensive MRSA colonization surveillance program. It is not intended to diagnose MRSA infection nor to guide or monitor treatment for MRSA infections.          Radiology Studies: No results found.      Scheduled Meds: . clindamycin (CLEOCIN) IV  600 mg Intravenous Q8H  . enoxaparin (LOVENOX) injection  40 mg Subcutaneous Daily   Continuous Infusions:    LOS: 0 days    Time spent: 25 min    Kelvin Cellar, MD [facility] Pager [phone]  If 7PM-7AM, please contact night-coverage [email or web address] Password [email or web address] 10/20/2015, 2:04 PM

## 2015-10-20 NOTE — Progress Notes (Signed)
Patient taking shower stated he pressed on his chin area and expressed lots of white pus and drainage.  Bethann Punches RN

## 2015-10-21 LAB — CBC
HEMATOCRIT: 34.4 % — AB (ref 39.0–52.0)
HEMOGLOBIN: 11.8 g/dL — AB (ref 13.0–17.0)
MCH: 28.3 pg (ref 26.0–34.0)
MCHC: 34.3 g/dL (ref 30.0–36.0)
MCV: 82.5 fL (ref 78.0–100.0)
Platelets: 199 10*3/uL (ref 150–400)
RBC: 4.17 MIL/uL — ABNORMAL LOW (ref 4.22–5.81)
RDW: 12.4 % (ref 11.5–15.5)
WBC: 5.4 10*3/uL (ref 4.0–10.5)

## 2015-10-21 LAB — BASIC METABOLIC PANEL
ANION GAP: 8 (ref 5–15)
BUN: 11 mg/dL (ref 6–20)
CHLORIDE: 97 mmol/L — AB (ref 101–111)
CO2: 27 mmol/L (ref 22–32)
Calcium: 9 mg/dL (ref 8.9–10.3)
Creatinine, Ser: 0.97 mg/dL (ref 0.61–1.24)
GFR calc non Af Amer: >60 mL/min (ref >60)
GLUCOSE: 107 mg/dL — AB (ref 65–99)
Potassium: 4.6 mmol/L (ref 3.5–5.1)
Sodium: 132 mmol/L — ABNORMAL LOW (ref 135–145)

## 2015-10-21 MED ORDER — IBUPROFEN 600 MG PO TABS: 600.0000 mg | ORAL_TABLET | Freq: Four times a day (QID) | ORAL | 0 refills | Status: DC | PRN

## 2015-10-21 MED ORDER — CLINDAMYCIN HCL 300 MG PO CAPS: 300.0000 mg | ORAL_CAPSULE | Freq: Three times a day (TID) | ORAL | 0 refills | Status: DC

## 2015-10-21 NOTE — Progress Notes (Signed)
Pt d/c'd home with oral abx. No concerns or questions. Prescription given. No pain at d/c.

## 2015-10-21 NOTE — Consult Note (Signed)
Reason for Consult:fascial infection Referring Physician: Kelvin Cellar, MD  William Waters is an 40 y.o. male.  HPI: he had a small cut on the skin of his chin on Friday. It got swollen over the weekend. He was admitted 2 days ago with cellulitis and abscess. Oral antibiotics did not help. He has been on intravenous antibiotics now for a couple of days. No history of MRSA exposure in the past that he knows of. Otherwise very good health. Yesterday in the shower, he had a lot of pus draining out of the area spontaneously. It feels a lot better today. He has defervesced.  Past Medical History:  Diagnosis Date  . GERD (gastroesophageal reflux disease)    diet controlled   . Hyperlipidemia   . Melanocytic nevus of trunk 2016   Compound Lentiginous type, on left mid medial chest    Past Surgical History:  Procedure Laterality Date  . LASIK    . SKIN LESION EXCISION     cyst, R hip  . TONSILLECTOMY AND ADENOIDECTOMY  1980s  . VASECTOMY  2016   Dr Amalia Hailey    Family History  Problem Relation Age of Onset  . Hyperlipidemia Father   . Hypertension Father   . Colitis Father   . Hyperlipidemia Mother   . Breast cancer Maternal Grandfather     GF    Social History:  reports that he has never smoked. He has never used smokeless tobacco. He reports that he drinks about 2.4 oz of alcohol per week . He reports that he does not use drugs.  Allergies:  Allergies  Allergen Reactions  . Penicillins     Has patient had a PCN reaction causing immediate rash, facial/tongue/throat swelling, SOB or lightheadedness with hypotension: N Has patient had a PCN reaction causing severe rash involving mucus membranes or skin necrosis: No Has patient had a PCN reaction that required hospitalization No Has patient had a PCN reaction occurring within the last 10 years: No If all of the above answers are "NO", then may proceed with Cephalosporin use. CHILDHOOD ALLERGY -PATIENT CANNOT CONFIRM ANY OF THE  REACTION    Medications: Reviewed  Results for orders placed or performed during the hospital encounter of 10/19/15 (from the past 48 hour(s))  CBC     Status: Abnormal   Collection Time: 10/20/15  4:45 AM  Result Value Ref Range   WBC 8.7 4.0 - 10.5 K/uL   RBC 4.41 4.22 - 5.81 MIL/uL   Hemoglobin 12.9 (L) 13.0 - 17.0 g/dL   HCT 37.5 (L) 39.0 - 52.0 %   MCV 85.0 78.0 - 100.0 fL   MCH 29.3 26.0 - 34.0 pg   MCHC 34.4 30.0 - 36.0 g/dL   RDW 13.1 11.5 - 15.5 %   Platelets 189 150 - 400 K/uL  CBC     Status: Abnormal   Collection Time: 10/21/15  4:58 AM  Result Value Ref Range   WBC 5.4 4.0 - 10.5 K/uL   RBC 4.17 (L) 4.22 - 5.81 MIL/uL   Hemoglobin 11.8 (L) 13.0 - 17.0 g/dL   HCT 34.4 (L) 39.0 - 52.0 %   MCV 82.5 78.0 - 100.0 fL   MCH 28.3 26.0 - 34.0 pg   MCHC 34.3 30.0 - 36.0 g/dL   RDW 12.4 11.5 - 15.5 %   Platelets 199 150 - 400 K/uL  Basic metabolic panel     Status: Abnormal   Collection Time: 10/21/15  4:58 AM  Result Value  Ref Range   Sodium 132 (L) 135 - 145 mmol/L   Potassium 4.6 3.5 - 5.1 mmol/L   Chloride 97 (L) 101 - 111 mmol/L   CO2 27 22 - 32 mmol/L   Glucose, Bld 107 (H) 65 - 99 mg/dL   BUN 11 6 - 20 mg/dL   Creatinine, Ser 0.97 0.61 - 1.24 mg/dL   Calcium 9.0 8.9 - 10.3 mg/dL   GFR calc non Af Amer >60 >60 mL/min   GFR calc Af Amer >60 >60 mL/min    Comment: (NOTE) The eGFR has been calculated using the CKD EPI equation. This calculation has not been validated in all clinical situations. eGFR's persistently <60 mL/min signify possible Chronic Kidney Disease.    Anion gap 8 5 - 15    No results found.  OCO:DIRYRYGB except as listed in admit H&P  Blood pressure (!) 143/84, pulse 68, temperature 99.1 F (37.3 C), temperature source Axillary, resp. rate 17, height 5' 8"  (1.727 m), weight 75.2 kg (165 lb 12.6 oz), SpO2 100 %.  PHYSICAL EXAM: Overall appearance:  Healthy appearing, in no distress Head:  Normocephalic, atraumatic. Ears: External ears  look normal. Nose: External nose is healthy in appearance. Internal nasal exam free of any lesions or obstruction. Oral Cavity/Pharynx:  There are no mucosal lesions or masses identified.fullness and induration of the lower lip and upper chin area. No palpable abscess. No purulent secretions expressed from the tiny opening. Mucosa intact on the inside. Larynx/Hypopharynx: Deferred Neuro:  No identifiable neurologic deficits. Neck: No palpable neck masses.  Studies Reviewed: none  Procedures: none   Assessment/Plan: Small abscess of the chin area/lower lip. Seems to have spontaneously drained. It is likely MRSA. He can probably transition to oral antibiotics and perhaps even go home this evening.  Suggest coverage for MRSA.There is no need for any further surgical treatment.  William Waters 10/21/2015, 5:01 PM

## 2015-10-21 NOTE — Progress Notes (Signed)
PROGRESS NOTE    William Waters  [id number] DOB: 10/24/1975 DOA: 10/19/2015 PCP: Kathlene November, MD   Brief Narrative:  William Waters is a pleasant 40 year old gentleman withvertigo past medical history admitted to the medicine service on 10/19/2015 when he presented with complaints of erythema and pain over mandibular region, had been treated with Bactrim in the outpatient setting. He was admitted for facial cellulitis and started on IV clindamycin.   Assessment & Plan:   Principal Problem:   Facial cellulitis  1.  Facial cellulitis -He presented to the emergency department on 10/19/2015 with complaints of pain, erythema, induration involving chin. -Initial lab work showing white blood cell count of 10.9 -On physical examination today he had induration over his chin, there was no fluctuant mass or purulence discharge noted -He was treated with IV clindamycin  -On 10/21/2015 he reported a purulent drainage from his chin wound overnight. I asked general surgery to see him. They evaluated him and recommended an ENT consultation. Consulted Dr Constance Holster of ENT. Await further recommendations.  DVT prophylaxis: Lovenox Code Status: Full code Family Communication: I spoke to his wife Disposition Plan: Anticipate discharge in the next 24 hours  Antimicrobials:   Clindamycin  Subjective: He reports having significant purulent drainage from chin wound overnight  Objective: Vitals:   10/20/15 1812 10/20/15 2300 10/21/15 0100 10/21/15 1416  BP:  123/76 120/64 (!) 143/84  Pulse:  66 67 68  Resp:  20 20 17   Temp: 99.4 F (37.4 C) 99.2 F (37.3 C) 99 F (37.2 C) 99.1 F (37.3 C)  TempSrc: Oral   Axillary  SpO2:  100% 97% 100%  Weight:      Height:        Intake/Output Summary (Last 24 hours) at 10/21/15 1554 Last data filed at 10/21/15 1416  Gross per 24 hour  Intake             1420 ml  Output                0 ml  Net             1420 ml   Filed Weights   10/19/15 0820 10/19/15 1200  10/20/15 0500  Weight: 77.1 kg (170 lb) 76.2 kg (168 lb) 75.2 kg (165 lb 12.6 oz)    Examination:  General exam: Appears calm and comfortable  Respiratory system: Clear to auscultation. Respiratory effort normal. Cardiovascular system: S1 & S2 heard, RRR. No JVD, murmurs, rubs, gallops or clicks. No pedal edema. Gastrointestinal system: Abdomen is nondistended, soft and nontender. No organomegaly or masses felt. Normal bowel sounds heard. Central nervous system: Alert and oriented. No focal neurological deficits. Extremities: Symmetric 5 x 5 power. Skin: There is erythema and induration involving chin, there may have been a small amount of purulent material expressed. There is associated swelling of lower lip Psychiatry: Judgement and insight appear normal. Mood & affect appropriate.     Data Reviewed: I have personally reviewed following labs and imaging studies  CBC:  Recent Labs Lab 10/19/15 0847 10/20/15 0445 10/21/15 0458  WBC 10.9* 8.7 5.4  NEUTROABS 8.6*  --   --   HGB 14.6 12.9* 11.8*  HCT 41.4 37.5* 34.4*  MCV 83.1 85.0 82.5  PLT 233 189 [id number]   Basic Metabolic Panel:  Recent Labs Lab 10/19/15 0847 10/21/15 0458  NA 135 132*  K 4.3 4.6  CL 100* 97*  CO2 26 27  GLUCOSE 127* 107*  BUN 7 11  CREATININE 1.18 0.97  CALCIUM 9.5 9.0   GFR: Estimated Creatinine Clearance: 96 mL/min (by C-G formula based on SCr of 0.97 mg/dL). Liver Function Tests: No results for input(s): AST, ALT, ALKPHOS, BILITOT, PROT, ALBUMIN in the last 168 hours. No results for input(s): LIPASE, AMYLASE in the last 168 hours. No results for input(s): AMMONIA in the last 168 hours. Coagulation Profile: No results for input(s): INR, PROTIME in the last 168 hours. Cardiac Enzymes: No results for input(s): CKTOTAL, CKMB, CKMBINDEX, TROPONINI in the last 168 hours. BNP (last 3 results) No results for input(s): PROBNP in the last 8760 hours. HbA1C: No results for input(s): HGBA1C in the  last 72 hours. CBG: No results for input(s): GLUCAP in the last 168 hours. Lipid Profile: No results for input(s): CHOL, HDL, LDLCALC, TRIG, CHOLHDL, LDLDIRECT in the last 72 hours. Thyroid Function Tests: No results for input(s): TSH, T4TOTAL, FREET4, T3FREE, THYROIDAB in the last 72 hours. Anemia Panel: No results for input(s): VITAMINB12, FOLATE, FERRITIN, TIBC, IRON, RETICCTPCT in the last 72 hours. Sepsis Labs:  Recent Labs Lab 10/19/15 0915  LATICACIDVEN 1.18    Recent Results (from the past 240 hour(s))  Urine culture     Status: None   Collection Time: 10/19/15  8:51 AM  Result Value Ref Range Status   Specimen Description URINE, RANDOM  Final   Special Requests NONE  Final   Culture NO GROWTH Performed at [facility]   Final   Report Status 10/20/2015 FINAL  Final  Culture, blood (Routine X 2) w Reflex to ID Panel     Status: None (Preliminary result)   Collection Time: 10/19/15  8:54 AM  Result Value Ref Range Status   Specimen Description BLOOD RIGHT [facility]  Final   Special Requests BOTTLES DRAWN AEROBIC AND ANAEROBIC 5CC  Final   Culture   Final    NO GROWTH 2 DAYS Performed at [facility]    Report Status PENDING  Incomplete  Culture, blood (Routine X 2) w Reflex to ID Panel     Status: None (Preliminary result)   Collection Time: 10/19/15  9:03 AM  Result Value Ref Range Status   Specimen Description BLOOD LFA  Final   Special Requests BOTTLES DRAWN AEROBIC AND ANAEROBIC 5CC  Final   Culture   Final    NO GROWTH 2 DAYS Performed at [facility]    Report Status PENDING  Incomplete  MRSA PCR Screening     Status: None   Collection Time: 10/19/15  2:50 PM  Result Value Ref Range Status   MRSA by PCR NEGATIVE NEGATIVE Final    Comment:        The GeneXpert MRSA Assay (FDA approved for NASAL specimens only), is one component of a comprehensive MRSA colonization surveillance program. It is not intended to diagnose MRSA infection  nor to guide or monitor treatment for MRSA infections.          Radiology Studies: No results found.      Scheduled Meds: . clindamycin (CLEOCIN) IV  600 mg Intravenous Q8H  . enoxaparin (LOVENOX) injection  40 mg Subcutaneous Daily   Continuous Infusions:    LOS: 1 day    Time spent: 25 min    Kelvin Cellar, MD [facility] Pager [phone]  If 7PM-7AM, please contact night-coverage [email or web address] Password [facility] 10/21/2015, 3:54 PM

## 2015-10-21 NOTE — Care Management Note (Signed)
Case Management Note  Patient Details  Name: William Waters MRN: [id number] Date of Birth: Jun 15, 1975  Subjective/Objective:                  facial cellulitis Action/Plan: Discharge planning Expected Discharge Date:  10/22/15               Expected Discharge Plan:  Home/Self Care  In-House Referral:  NA  Discharge planning Services  CM Consult  Post Acute Care Choice:  NA Choice offered to:     DME Arranged:    DME Agency:  NA  HH Arranged:  NA HH Agency:  NA  Status of Service:  Completed, signed off  If discussed at [place] of Stay Meetings, dates discussed:    Additional Comments: Pt hs BCBS insurance; PCP Kathlene November, MD; is ind with all ADLs and plan is to return home after hospitalization with NO anticipated HH needs.   Dellie Catholic, RN 10/21/2015, 12:31 PM
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
## 2015-10-23 NOTE — Telephone Encounter (Signed)
Telephone note opened in error

## 2015-10-24 LAB — CULTURE, BLOOD (ROUTINE X 2)
CULTURE: NO GROWTH
CULTURE: NO GROWTH

## 2015-10-30 ENCOUNTER — Ambulatory Visit (INDEPENDENT_AMBULATORY_CARE_PROVIDER_SITE_OTHER): Payer: BLUE CROSS/BLUE SHIELD | Admitting: Internal Medicine

## 2015-10-30 ENCOUNTER — Encounter: Payer: Self-pay | Admitting: Internal Medicine

## 2015-10-30 VITALS — BP 112/78 | HR 69 | Temp 98.2°F | Resp 14 | Ht 68.0 in | Wt 172.4 lb

## 2015-10-30 DIAGNOSIS — L0201 Cutaneous abscess of face: Secondary | ICD-10-CM

## 2015-10-30 NOTE — Patient Instructions (Signed)
Call if you have any problems

## 2015-10-30 NOTE — Progress Notes (Signed)
Subjective:    Patient ID: William Waters, male    DOB: 01-19-1976, 40 y.o.   MRN: [id number]  DOS:  10/30/2015 Type of visit - description : Hospital follow-up Interval history: Admitted 10/19/2015 for 2 days: He developed facial cellulitis after he had a cut on the skin (chin), failed to improve with po Bactrim, went to the ER, was febrile and admitted for IV antibiotics. The area drained spontaneously, was seen by ENT, no I&D was felt to be necessary and was discharged on clindamycin. Cultures negative.  Review of Systems  Since he left the hospital, he is gradually getting better, back to normal now. Finished the antibiotic. Had some nausea initially but that is resolved. No vomiting or diarrhea No fever chills Facial swelling gone.  Past Medical History:  Diagnosis Date  . GERD (gastroesophageal reflux disease)    diet controlled   . Hyperlipidemia   . Melanocytic nevus of trunk 2016   Compound Lentiginous type, on left mid medial chest    Past Surgical History:  Procedure Laterality Date  . LASIK    . SKIN LESION EXCISION     cyst, R hip  . TONSILLECTOMY AND ADENOIDECTOMY  1980s  . VASECTOMY  2016   Dr Amalia Hailey    Social History   Social History  . Marital status: Married    Spouse name: N/A  . Number of children: 3  . Years of education: N/A   Occupational History  . business owner     Social History Main Topics  . Smoking status: Never Smoker  . Smokeless tobacco: Never Used  . Alcohol use 2.4 oz/week    4 Cans of beer per week     Comment: socially   . Drug use: No  . Sexual activity: Not on file   Other Topics Concern  . Not on file   Social History Narrative   Household-- wife and 3 children last born 02-2014        Medication List       Accurate as of 10/30/15 11:59 PM. Always use your most recent med list.          clindamycin 300 MG capsule Commonly known as:  CLEOCIN Take 1 capsule (300 mg total) by mouth 3 (three) times daily.   ibuprofen 600 MG tablet Commonly known as:  ADVIL,MOTRIN Take 1 tablet (600 mg total) by mouth every 6 (six) hours as needed for headache.          Objective:   Physical Exam  HENT:  Head:     BP 112/78 (BP Location: Left Arm, Patient Position: Sitting, Cuff Size: Normal)   Pulse 69   Temp 98.2 F (36.8 C) (Oral)   Resp 14   Ht 5\' 8"  (1.727 m)   Wt 172 lb 6 oz (78.2 kg)   SpO2 99%   BMI 26.21 kg/m  General:   Well developed, well nourished . NAD.  HEENT:  Normocephalic . Face symmetric, atraumatic Lungs:  CTA B Normal respiratory effort, no intercostal retractions, no accessory muscle use. Heart: RRR,  no murmur.  No pretibial edema bilaterally  Skin: Not pale. Not jaundice Neurologic:  alert & oriented X3.  Speech normal, gait appropriate for age and unassisted Psych--  Cognition and judgment appear intact.  Cooperative with normal attention span and concentration.  Behavior appropriate. No anxious or depressed appearing.      Assessment & Plan:  Assessment Hyperlipidemia GERD Compound Lentiginous type, on left mid medial  chest  PLAN: Facial abscess: Status post IV and PO , doing better. Induration of the area of the abscess >>>doubt active infection. Recommend no further treatment, will call if fever, chills, swelling in the face or any other problems resurface. Multiple questions  answer  about infection.  Today, I spent more than  20  min with the patient: >50% of the time counseling regards his recent infection, had multiple questions about it. Also reviewing the chart and labs ordered by other providers

## 2015-10-30 NOTE — Progress Notes (Signed)
Pre visit review using our clinic review tool, if applicable. No additional management support is needed unless otherwise documented below in the visit note. 

## 2015-10-31 NOTE — Assessment & Plan Note (Signed)
Facial abscess: Status post IV and PO , doing better. Induration of the area of the abscess >>>doubt active infection. Recommend no further treatment, will call if fever, chills, swelling in the face or any other problems resurface. Multiple questions  answer  about infection.

## 2016-02-13 ENCOUNTER — Encounter: Payer: Self-pay | Admitting: Family Medicine

## 2016-02-13 ENCOUNTER — Ambulatory Visit (INDEPENDENT_AMBULATORY_CARE_PROVIDER_SITE_OTHER): Payer: BLUE CROSS/BLUE SHIELD | Admitting: Family Medicine

## 2016-02-13 VITALS — BP 116/64 | HR 64 | Temp 97.9°F | Ht 68.0 in | Wt 175.0 lb

## 2016-02-13 DIAGNOSIS — L03221 Cellulitis of neck: Secondary | ICD-10-CM | POA: Diagnosis not present

## 2016-02-13 DIAGNOSIS — Z23 Encounter for immunization: Secondary | ICD-10-CM

## 2016-02-13 MED ORDER — SULFAMETHOXAZOLE-TRIMETHOPRIM 800-160 MG PO TABS: 1.0000 | ORAL_TABLET | Freq: Two times a day (BID) | ORAL | 0 refills | Status: DC

## 2016-02-13 NOTE — Progress Notes (Signed)
Chief Complaint  Patient presents with  . Rash    Pt reports rash on neck and wife had staff on face x2 weeks    Subjective: Patient is a 40 y.o. male here for rash.  Redness, itching and pain on neck for 10 days. Thought it was razor burn, started to spread. No drainage or fevers. No new lotions, soaps, topicals or detergents. +sick contact with wife dx'd with staph infection on face. 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  ROS: Skin: as noted in HPI  Family History  Problem Relation Age of Onset  . Hyperlipidemia Father   . Hypertension Father   . Colitis Father   . Hyperlipidemia Mother   . Breast cancer Maternal Grandfather     GF   Past Medical History:  Diagnosis Date  . GERD (gastroesophageal reflux disease)    diet controlled   . Hyperlipidemia   . Melanocytic nevus of trunk 2016   Compound Lentiginous type, on left mid medial chest   Allergies  Allergen Reactions  . Penicillins     Has patient had a PCN reaction causing immediate rash, facial/tongue/throat swelling, SOB or lightheadedness with hypotension: N Has patient had a PCN reaction causing severe rash involving mucus membranes or skin necrosis: No Has patient had a PCN reaction that required hospitalization No Has patient had a PCN reaction occurring within the last 10 years: No If all of the above answers are "NO", then may proceed with Cephalosporin use. CHILDHOOD ALLERGY -PATIENT CANNOT CONFIRM ANY OF THE REACTION    Current Outpatient Prescriptions:  .  ibuprofen (ADVIL,MOTRIN) 600 MG tablet, Take 1 tablet (600 mg total) by mouth every 6 (six) hours as needed for headache. (Patient not taking: Reported on 02/13/2016), Disp: 30 tablet, Rfl: 0 .  sulfamethoxazole-trimethoprim (BACTRIM DS) 800-160 MG tablet, Take 1 tablet by mouth 2 (two) times daily., Disp: 20 tablet, Rfl: 0  Objective: BP 116/64 (BP Location: Left Arm, Patient Position: Sitting, Cuff Size: Small)   Pulse 64   Temp 97.9 F (36.6 C) (Oral)   Ht  5\' 8"  (1.727 m)   Wt 175 lb (79.4 kg)   BMI 26.61 kg/m  General: Awake, appears stated age Heart: RRR, no murmurs Lungs: CTAB, no rales, wheezes or rhonchi. No accessory muscle use Skin: on R side of neck under chin, there are small areas of excoriation and associated raised lesions from inflammation, the entire area, approx 6.5 x 4.5 cm in dimension, is erythematous. I do not appreciate significant warmth, mildly TTP. No drainage or fluctuance. Psych: Age appropriate judgment and insight, normal affect and mood  Assessment and Plan: Cellulitis of neck - Plan: sulfamethoxazole-trimethoprim (BACTRIM DS) 800-160 MG tablet  Encounter for immunization - Plan: Flu Vaccine QUAD 36+ mos IM  Orders as above.  Given hx, will tx for cellulitis. Warning signs given in AVS (fevers, worsening pain, spreading redness, draining pus). F/u prn. The patient voiced understanding and agreement to the plan.  [place], DO 02/13/16  2:22 PM

## 2016-02-13 NOTE — Patient Instructions (Signed)
If you start having fevers, pus draining from your lesion, spreading redness, or worsening pain.

## 2016-02-13 NOTE — Progress Notes (Signed)
Pre visit review using our clinic review tool, if applicable. No additional management support is needed unless otherwise documented below in the visit note. 

## 2016-05-10 ENCOUNTER — Encounter: Payer: Self-pay | Admitting: Internal Medicine

## 2016-05-10 ENCOUNTER — Ambulatory Visit (INDEPENDENT_AMBULATORY_CARE_PROVIDER_SITE_OTHER): Payer: BLUE CROSS/BLUE SHIELD | Admitting: Internal Medicine

## 2016-05-10 VITALS — BP 118/68 | HR 68 | Temp 97.9°F | Resp 14 | Ht 68.0 in | Wt 177.2 lb

## 2016-05-10 DIAGNOSIS — Z0000 Encounter for general adult medical examination without abnormal findings: Secondary | ICD-10-CM | POA: Diagnosis not present

## 2016-05-10 LAB — CBC WITH DIFFERENTIAL/PLATELET
BASOS PCT: 0.3 % (ref 0.0–3.0)
Basophils Absolute: 0 10*3/uL (ref 0.0–0.1)
EOS ABS: 0 10*3/uL (ref 0.0–0.7)
EOS PCT: 0.1 % (ref 0.0–5.0)
HEMATOCRIT: 40.3 % (ref 39.0–52.0)
HEMOGLOBIN: 14.2 g/dL (ref 13.0–17.0)
Lymphocytes Relative: 10 % — ABNORMAL LOW (ref 12.0–46.0)
Lymphs Abs: 1 10*3/uL (ref 0.7–4.0)
MCHC: 35.3 g/dL (ref 30.0–36.0)
MCV: 83.3 fl (ref 78.0–100.0)
MONO ABS: 0.7 10*3/uL (ref 0.1–1.0)
Monocytes Relative: 6.6 % (ref 3.0–12.0)
Neutro Abs: 8.6 10*3/uL — ABNORMAL HIGH (ref 1.4–7.7)
Neutrophils Relative %: 83 % — ABNORMAL HIGH (ref 43.0–77.0)
Platelets: 227 10*3/uL (ref 150.0–400.0)
RBC: 4.83 Mil/uL (ref 4.22–5.81)
RDW: 13.2 % (ref 11.5–15.5)
WBC: 10.3 10*3/uL (ref 4.0–10.5)

## 2016-05-10 LAB — LIPID PANEL
CHOLESTEROL: 242 mg/dL — AB (ref 0–200)
HDL: 67.6 mg/dL (ref >39.00)
LDL Cholesterol: 151 mg/dL — ABNORMAL HIGH (ref 0–99)
NonHDL: 174.58
Total CHOL/HDL Ratio: 4
Triglycerides: 120 mg/dL (ref 0.0–149.0)
VLDL: 24 mg/dL (ref 0.0–40.0)

## 2016-05-10 LAB — COMPREHENSIVE METABOLIC PANEL
ALBUMIN: 4.6 g/dL (ref 3.5–5.2)
ALK PHOS: 55 U/L (ref 39–117)
ALT: 24 U/L (ref 0–53)
AST: 22 U/L (ref 0–37)
BUN: 13 mg/dL (ref 6–23)
CALCIUM: 9.6 mg/dL (ref 8.4–10.5)
CHLORIDE: 99 meq/L (ref 96–112)
CO2: 29 mEq/L (ref 19–32)
Creatinine, Ser: 1.02 mg/dL (ref 0.40–1.50)
GFR: 84.83 mL/min (ref >60.00)
Glucose, Bld: 79 mg/dL (ref 70–99)
POTASSIUM: 4.3 meq/L (ref 3.5–5.1)
Sodium: 136 mEq/L (ref 135–145)
Total Bilirubin: 1 mg/dL (ref 0.2–1.2)
Total Protein: 7 g/dL (ref 6.0–8.3)

## 2016-05-10 LAB — TSH: TSH: 1.19 u[IU]/mL (ref 0.35–4.50)

## 2016-05-10 NOTE — Assessment & Plan Note (Addendum)
Td 2013 Continue with his healthy lifestyle  Labs, see orders RTC 1 year

## 2016-05-10 NOTE — Patient Instructions (Signed)
GO TO THE LAB : Get the blood work     GO TO THE FRONT DESK Schedule your next appointment for a  Physical exam in 1 year 

## 2016-05-10 NOTE — Progress Notes (Signed)
Pre visit review using our clinic review tool, if applicable. No additional management support is needed unless otherwise documented below in the visit note. 

## 2016-05-10 NOTE — Progress Notes (Signed)
Subjective:    Patient ID: William Waters, male    DOB: Sep 21, 1975, 41 y.o.   MRN: [id number]  DOS:  05/10/2016 Type of visit - description : cpx  Interval history: wife recently admitted w/ a bipolar exacerbation, ++ stress   Wt Readings from Last 3 Encounters:  05/10/16 177 lb 4 oz (80.4 kg)  02/13/16 175 lb (79.4 kg)  10/30/15 172 lb 6 oz (78.2 kg)     Review of Systems Constitutional: No fever. No chills. No unexplained wt changes. No unusual sweats  HEENT: No dental problems, no ear discharge, no facial swelling, no voice changes. No eye discharge, no eye  redness , no  intolerance to light   Respiratory: No wheezing , no  difficulty breathing. No cough , no mucus production  Cardiovascular: No CP, no leg swelling , no  Palpitations  GI: no nausea, no vomiting, no diarrhea , no  abdominal pain.  No blood in the stools. No dysphagia, no odynophagia    Endocrine: No polyphagia, no polyuria , no polydipsia  GU: No dysuria, gross hematuria, difficulty urinating. No urinary urgency, no frequency.  Musculoskeletal: No joint swellings or unusual aches or pains  Skin: No change in the color of the skin, palor , no  Rash  Allergic, immunologic: No environmental allergies , no  food allergies  Neurological: No dizziness no  syncope. No headaches. No diplopia, no slurred, no slurred speech, no motor deficits, no facial  Numbness  Hematological: No enlarged lymph nodes, no easy bruising , no unusual bleedings  Psychiatry: No suicidal ideas, no hallucinations, no beavior problems, no confusion.      Past Medical History:  Diagnosis Date  . GERD (gastroesophageal reflux disease)    diet controlled   . Hyperlipidemia   . Melanocytic nevus of trunk 2016   Compound Lentiginous type, on left mid medial chest    Past Surgical History:  Procedure Laterality Date  . LASIK    . SKIN LESION EXCISION     cyst, R hip  . TONSILLECTOMY AND ADENOIDECTOMY  1980s  . VASECTOMY   2016   Dr Amalia Hailey    Social History   Social History  . Marital status: Married    Spouse name: N/A  . Number of children: 3  . Years of education: N/A   Occupational History  . business owner     Social History Main Topics  . Smoking status: Never Smoker  . Smokeless tobacco: Never Used  . Alcohol use 2.4 oz/week    4 Cans of beer per week     Comment: socially   . Drug use: No  . Sexual activity: Not on file   Other Topics Concern  . Not on file   Social History Narrative   Household-- wife and 3 children last born 02-2014     Family History  Problem Relation Age of Onset  . Hyperlipidemia Father   . Hypertension Father   . Colitis Father   . Hyperlipidemia Mother   . Breast cancer Maternal Grandfather     GF     Allergies as of 05/10/2016      Reactions   Penicillins    Has patient had a PCN reaction causing immediate rash, facial/tongue/throat swelling, SOB or lightheadedness with hypotension: N Has patient had a PCN reaction causing severe rash involving mucus membranes or skin necrosis: No Has patient had a PCN reaction that required hospitalization No Has patient had a PCN reaction occurring within  the last 10 years: No If all of the above answers are "NO", then may proceed with Cephalosporin use. CHILDHOOD ALLERGY -PATIENT CANNOT CONFIRM ANY OF THE REACTION      Medication List    as of 05/10/2016 10:06 AM   You have not been prescribed any medications.        Objective:   Physical Exam BP 118/68 (BP Location: Left Arm, Patient Position: Sitting, Cuff Size: Normal)   Pulse 68   Temp 97.9 F (36.6 C) (Oral)   Resp 14   Ht 5\' 8"  (1.727 m)   Wt 177 lb 4 oz (80.4 kg)   SpO2 98%   BMI 26.95 kg/m  General:   Well developed, well nourished . NAD.  HEENT:  Normocephalic . Face symmetric, atraumatic Lungs:  CTA B Normal respiratory effort, no intercostal retractions, no accessory muscle use. Heart: RRR,  no murmur.  No pretibial edema  bilaterally  Abdomen:  Not distended, soft, non-tender. No rebound or rigidity.   Skin: Exposed areas without rash. Not pale. Not jaundice Neurologic:  alert & oriented X3.  Speech normal, gait appropriate for age and unassisted Strength symmetric and appropriate for age.  Psych: Cognition and judgment appear intact.  Cooperative with normal attention span and concentration.  Behavior appropriate. No anxious or depressed appearing.     Assessment & Plan:  Assessment Hyperlipidemia GERD Compound Lentiginous type, on left mid medial chest  PLAN: Here for a CPX. We had a long conversation about his wife, she is bipolar, recently admitted to the hospital with a severe exacerbation. He has 3 children , 43, 91 and 1 years old, things are difficult at home. Patient is counseled to the best of my ability. His wife is my patient, will see her on follow-up. RTC one year

## 2016-05-10 NOTE — Assessment & Plan Note (Signed)
Here for a CPX. We had a long conversation about his wife, she is bipolar, recently admitted to the hospital with a severe exacerbation. He has 3 children , 5, 28 and 43 years old, things are difficult at home. Patient is counseled to the best of my ability. His wife is my patient, will see her on follow-up. RTC one year

## 2016-05-12 ENCOUNTER — Encounter: Payer: Self-pay | Admitting: Internal Medicine

## 2016-05-12 DIAGNOSIS — R7989 Other specified abnormal findings of blood chemistry: Secondary | ICD-10-CM

## 2016-05-13 NOTE — Telephone Encounter (Signed)
Patient scheduled labs only for 11/10/16 at 1:15pm

## 2016-09-30 ENCOUNTER — Encounter: Payer: Self-pay | Admitting: Internal Medicine

## 2016-09-30 DIAGNOSIS — M25462 Effusion, left knee: Secondary | ICD-10-CM

## 2016-09-30 DIAGNOSIS — M25562 Pain in left knee: Principal | ICD-10-CM

## 2016-10-07 DIAGNOSIS — M2242 Chondromalacia patellae, left knee: Secondary | ICD-10-CM | POA: Diagnosis not present

## 2016-11-10 ENCOUNTER — Other Ambulatory Visit (INDEPENDENT_AMBULATORY_CARE_PROVIDER_SITE_OTHER): Payer: BLUE CROSS/BLUE SHIELD

## 2016-11-10 DIAGNOSIS — R7989 Other specified abnormal findings of blood chemistry: Secondary | ICD-10-CM

## 2016-11-10 LAB — CBC WITH DIFFERENTIAL/PLATELET
BASOS PCT: 0.8 % (ref 0.0–3.0)
Basophils Absolute: 0 10*3/uL (ref 0.0–0.1)
EOS PCT: 0.9 % (ref 0.0–5.0)
Eosinophils Absolute: 0 10*3/uL (ref 0.0–0.7)
HCT: 39.2 % (ref 39.0–52.0)
Hemoglobin: 13.7 g/dL (ref 13.0–17.0)
LYMPHS ABS: 1.5 10*3/uL (ref 0.7–4.0)
Lymphocytes Relative: 27.4 % (ref 12.0–46.0)
MCHC: 35.1 g/dL (ref 30.0–36.0)
MCV: 85.1 fl (ref 78.0–100.0)
MONO ABS: 0.4 10*3/uL (ref 0.1–1.0)
MONOS PCT: 8.2 % (ref 3.0–12.0)
Neutro Abs: 3.3 10*3/uL (ref 1.4–7.7)
Neutrophils Relative %: 62.7 % (ref 43.0–77.0)
Platelets: 246 10*3/uL (ref 150.0–400.0)
RBC: 4.6 Mil/uL (ref 4.22–5.81)
RDW: 13.1 % (ref 11.5–15.5)
WBC: 5.3 10*3/uL (ref 4.0–10.5)

## 2016-11-22 DIAGNOSIS — M2242 Chondromalacia patellae, left knee: Secondary | ICD-10-CM | POA: Diagnosis not present

## 2016-11-29 DIAGNOSIS — M2242 Chondromalacia patellae, left knee: Secondary | ICD-10-CM | POA: Diagnosis not present

## 2016-12-06 DIAGNOSIS — S83242A Other tear of medial meniscus, current injury, left knee, initial encounter: Secondary | ICD-10-CM | POA: Diagnosis not present

## 2016-12-23 DIAGNOSIS — X58XXXA Exposure to other specified factors, initial encounter: Secondary | ICD-10-CM | POA: Diagnosis not present

## 2016-12-23 DIAGNOSIS — Y999 Unspecified external cause status: Secondary | ICD-10-CM | POA: Diagnosis not present

## 2016-12-23 DIAGNOSIS — S83242A Other tear of medial meniscus, current injury, left knee, initial encounter: Secondary | ICD-10-CM | POA: Diagnosis not present

## 2016-12-23 DIAGNOSIS — Y929 Unspecified place or not applicable: Secondary | ICD-10-CM | POA: Diagnosis not present

## 2016-12-23 DIAGNOSIS — G8918 Other acute postprocedural pain: Secondary | ICD-10-CM | POA: Diagnosis not present

## 2016-12-29 DIAGNOSIS — M25662 Stiffness of left knee, not elsewhere classified: Secondary | ICD-10-CM | POA: Diagnosis not present

## 2016-12-31 DIAGNOSIS — M25662 Stiffness of left knee, not elsewhere classified: Secondary | ICD-10-CM | POA: Diagnosis not present

## 2017-01-03 DIAGNOSIS — M25662 Stiffness of left knee, not elsewhere classified: Secondary | ICD-10-CM | POA: Diagnosis not present

## 2017-01-05 DIAGNOSIS — M25662 Stiffness of left knee, not elsewhere classified: Secondary | ICD-10-CM | POA: Diagnosis not present

## 2017-02-18 ENCOUNTER — Encounter: Payer: Self-pay | Admitting: Internal Medicine

## 2017-02-18 ENCOUNTER — Ambulatory Visit (INDEPENDENT_AMBULATORY_CARE_PROVIDER_SITE_OTHER): Payer: BLUE CROSS/BLUE SHIELD | Admitting: Internal Medicine

## 2017-02-18 VITALS — BP 106/62 | HR 58 | Temp 98.1°F | Resp 14 | Ht 68.0 in | Wt 171.0 lb

## 2017-02-18 DIAGNOSIS — H1031 Unspecified acute conjunctivitis, right eye: Secondary | ICD-10-CM | POA: Diagnosis not present

## 2017-02-18 MED ORDER — TOBRAMYCIN 0.3 % OP SOLN: 1.0000 [drp] | Freq: Four times a day (QID) | OPHTHALMIC | 0 refills | Status: DC

## 2017-02-18 NOTE — Progress Notes (Signed)
Pre visit review using our clinic review tool, if applicable. No additional management support is needed unless otherwise documented below in the visit note. 

## 2017-02-18 NOTE — Progress Notes (Signed)
Subjective:    Patient ID: William Waters, male    DOB: [birth year], 41 y.o.   MRN: [id number]  DOS:  02/18/2017 Type of visit - description : acute Interval history: His daughter had pink eye  few days ago. Last night, the patient developed foreign body feeling of the right eye, it started to be watery. In the morning he had some dry discharge at the corner of the eye. Reports mild eye pain similar to the last time he had pinkeye.   Review of Systems No visual disturbances. Minimal redness if any  Past Medical History:  Diagnosis Date  . GERD (gastroesophageal reflux disease)    diet controlled   . Hyperlipidemia   . Melanocytic nevus of trunk 2016   Compound Lentiginous type, on left mid medial chest    Past Surgical History:  Procedure Laterality Date  . LASIK    . SKIN LESION EXCISION     cyst, R hip  . TONSILLECTOMY AND ADENOIDECTOMY  1980s  . VASECTOMY  2016   Dr Amalia Hailey    Social History   Socioeconomic History  . Marital status: Married    Spouse name: Not on file  . Number of children: 3  . Years of education: Not on file  . Highest education level: Not on file  Social Needs  . Financial resource strain: Not on file  . Food insecurity - worry: Not on file  . Food insecurity - inability: Not on file  . Transportation needs - medical: Not on file  . Transportation needs - non-medical: Not on file  Occupational History  . Occupation: Armed forces operational officer   Tobacco Use  . Smoking status: Never Smoker  . Smokeless tobacco: Never Used  Substance and Sexual Activity  . Alcohol use: Yes    Alcohol/week: 2.4 oz    Types: 4 Cans of beer per week    Comment: socially   . Drug use: No  . Sexual activity: Not on file  Other Topics Concern  . Not on file  Social History Narrative   Household-- wife and 3 children last born 02-2014      Allergies as of 02/18/2017      Reactions   Penicillins    Has patient had a PCN reaction causing immediate rash,  facial/tongue/throat swelling, SOB or lightheadedness with hypotension: N Has patient had a PCN reaction causing severe rash involving mucus membranes or skin necrosis: No Has patient had a PCN reaction that required hospitalization No Has patient had a PCN reaction occurring within the last 10 years: No If all of the above answers are "NO", then may proceed with Cephalosporin use. CHILDHOOD ALLERGY -PATIENT CANNOT CONFIRM ANY OF THE REACTION      Medication List    as of 02/18/2017  2:59 PM   You have not been prescribed any medications.        Objective:   Physical Exam BP 106/62 (BP Location: Left Arm, Patient Position: Sitting, Cuff Size: Small)   Pulse (!) 58   Temp 98.1 F (36.7 C) (Oral)   Resp 14   Ht 5\' 8"  (1.727 m)   Wt 171 lb (77.6 kg)   SpO2 97%   BMI 26.00 kg/m  General:   Well developed, well nourished . NAD.  HEENT:  Normocephalic . Face symmetric, atraumatic EOMI, pupils reactive, right eye w/ moderate watery d/c, not red. Anterior chambers normal. Skin: Not pale. Not jaundice Neurologic:  alert & oriented X3.  Speech normal,  gait appropriate for age and unassisted Psych--  Cognition and judgment appear intact.  Cooperative with normal attention span and concentration.  Behavior appropriate. No anxious or depressed appearing.      Assessment & Plan:    Assessment Hyperlipidemia GERD Compound Lentiginous type, on left mid medial chest  PLAN:  Conjunctivitis, mild : treat w/ Tobrex, cold compresses, call if not better particularly if he has more eye pain. Stress: Wife has bipolar, fortunately she is doing better for the last several weeks.

## 2017-02-18 NOTE — Patient Instructions (Signed)
Use the drops every 6 hours x 3 days  Call if not gradually better  ER or urgent care if severe symptoms particularly increase eyeball pain, difficulty with your vision

## 2017-02-20 NOTE — Assessment & Plan Note (Signed)
Conjunctivitis, mild : treat w/ Tobrex, cold compresses, call if not better particularly if he has more eye pain. Stress: Wife has bipolar, fortunately she is doing better for the last several weeks.

## 2017-03-04 ENCOUNTER — Encounter: Payer: Self-pay | Admitting: Internal Medicine

## 2017-03-04 ENCOUNTER — Ambulatory Visit: Payer: BLUE CROSS/BLUE SHIELD | Admitting: Internal Medicine

## 2017-03-04 ENCOUNTER — Ambulatory Visit: Payer: Self-pay

## 2017-03-04 VITALS — BP 122/80 | HR 68 | Temp 97.8°F | Resp 14 | Ht 68.0 in | Wt 171.5 lb

## 2017-03-04 DIAGNOSIS — F419 Anxiety disorder, unspecified: Secondary | ICD-10-CM | POA: Diagnosis not present

## 2017-03-04 DIAGNOSIS — R0602 Shortness of breath: Secondary | ICD-10-CM

## 2017-03-04 DIAGNOSIS — R002 Palpitations: Secondary | ICD-10-CM | POA: Diagnosis not present

## 2017-03-04 LAB — CBC WITH DIFFERENTIAL/PLATELET
BASOS ABS: 31 {cells}/uL (ref 0–200)
Basophils Relative: 0.6 %
Eosinophils Absolute: 31 cells/uL (ref 15–500)
Eosinophils Relative: 0.6 %
HEMATOCRIT: 38.7 % (ref 38.5–50.0)
Hemoglobin: 13.6 g/dL (ref 13.2–17.1)
LYMPHS ABS: 1601 {cells}/uL (ref 850–3900)
MCH: 29.8 pg (ref 27.0–33.0)
MCHC: 35.1 g/dL (ref 32.0–36.0)
MCV: 84.9 fL (ref 80.0–100.0)
MPV: 9.8 fL (ref 7.5–12.5)
Monocytes Relative: 9.2 %
NEUTROS PCT: 58.2 %
Neutro Abs: 2968 cells/uL (ref 1500–7800)
Platelets: 249 10*3/uL (ref 140–400)
RBC: 4.56 10*6/uL (ref 4.20–5.80)
RDW: 12.8 % (ref 11.0–15.0)
Total Lymphocyte: 31.4 %
WBC: 5.1 10*3/uL (ref 3.8–10.8)
WBCMIX: 469 {cells}/uL (ref 200–950)

## 2017-03-04 LAB — BASIC METABOLIC PANEL
BUN: 12 mg/dL (ref 7–25)
CHLORIDE: 100 mmol/L (ref 98–110)
CO2: 28 mmol/L (ref 20–32)
CREATININE: 0.97 mg/dL (ref 0.60–1.35)
Calcium: 9.8 mg/dL (ref 8.6–10.3)
Glucose, Bld: 86 mg/dL (ref 65–99)
Potassium: 5 mmol/L (ref 3.5–5.3)
Sodium: 138 mmol/L (ref 135–146)

## 2017-03-04 LAB — D-DIMER, QUANTITATIVE: D-Dimer, Quant: <0.19 mcg/mL FEU (ref <0.50)

## 2017-03-04 NOTE — Progress Notes (Signed)
Pre visit review using our clinic review tool, if applicable. No additional management support is needed unless otherwise documented below in the visit note. 

## 2017-03-04 NOTE — Telephone Encounter (Signed)
FYI

## 2017-03-04 NOTE — Patient Instructions (Signed)
GO TO THE LAB : Get the blood work     We are checking a test, called d-dimer, if it comes back negative, most likely you will be asked to go to the ER to get a CT of your chest.  If you have severe or persistent symptoms, call or go to the ER

## 2017-03-04 NOTE — Telephone Encounter (Signed)
Noted  

## 2017-03-04 NOTE — Telephone Encounter (Signed)
Phone call from pt.  Reported having intermittent episodes that described as needing to take deep breath, to get enough oxygen.  Stated he had an episode on Wednesday evening, and also again today. Stated he was able to work out at [employer] today, without problems at that time.  BP 124/83, pulse 64 and regular.  Stated he has feeling that his heart rate increases at times, and reported "feeling a heaviness in his chest when he needs to get a deep breath."  Stated "it's not actually pain."  Denies any upper resp. symptoms of nasal or chest congestion, or cough. Per protocol, advised pt. To see MD within 4 hours.  Pt. questioned if his lack of sleep could be causing his symptoms?  Appt. Given for today with PCP at 2:40 PM.  Agrees with plan.                 Reason for Disposition . [1] MILD difficulty breathing (e.g., minimal/no SOB at rest, SOB with walking, pulse <100) AND [2] NEW-onset or WORSE than normal  Answer Assessment - Initial Assessment Questions 1. RESPIRATORY STATUS: "Describe your breathing?" (e.g., wheezing, shortness of breath, unable to speak, severe coughing)      Normal at present time; but at times feels like he needs to take deep breaths to get enough oxygen 2. ONSET: "When did this breathing problem begin?"      Wed. evening  3. PATTERN "Does the difficult breathing come and go, or has it been constant since it started?"      Comes and goes 4. SEVERITY: "How bad is your breathing?" (e.g., mild, moderate, severe)    - MILD: No SOB at rest, mild SOB with walking, speaks normally in sentences, can lay down, no retractions, pulse < 100.    - MODERATE: SOB at rest, SOB with minimal exertion and prefers to sit, cannot lie down flat, speaks in phrases, mild retractions, audible wheezing, pulse 100-120.    - SEVERE: Very SOB at rest, speaks in single words, struggling to breathe, sitting hunched forward, retractions, pulse > 120      Mild- moderate  BP @ 10:30 124/83; P. 64 5. RECURRENT  SYMPTOM: "Have you had difficulty breathing before?" If so, ask: "When was the last time?" and "What happened that time?"      no 6. CARDIAC HISTORY: "Do you have any history of heart disease?" (e.g., heart attack, angina, bypass surgery, angioplasty)      None  7. LUNG HISTORY: "Do you have any history of lung disease?"  (e.g., pulmonary embolus, asthma, emphysema)     No asthma, or emphysema; no PE 8. CAUSE: "What do you think is causing the breathing problem?"      Unknown / may be stress 9. OTHER SYMPTOMS: "Do you have any other symptoms? (e.g., dizziness, runny nose, cough, chest pain, fever)    Denies any of above symptoms; at times increases need to deep breath; denies chest pain; stated a "heaviness in my chest intermittently"  Able to do full gym work out today without problems   10. PREGNANCY: "Is there any chance you are pregnant?" "When was your last menstrual period?"      n/a 11. TRAVEL: "Have you traveled out of the country in the last month?" (e.g., travel history, exposures)       no  Protocols used: BREATHING DIFFICULTY-A-AH

## 2017-03-04 NOTE — Progress Notes (Signed)
Subjective:    Patient ID: William Waters, male    DOB: 10/09/1975, 41 y.o.   MRN: [id number]  DOS:  03/04/2017 Type of visit - description : acute Interval history: Chief complaint today is palpitations Had a episode 2 days ago and today at 8 AM.  Both times not exertional. He feels that his heart is racing, his chest feels tight, has some difficulty breathing ("I can't get enough oxygen, I can't take a deep breath"). Unsure as of how long it lasts, 2 hours?. There was no associated sweats, clamminess, near syncope. He has very strenuous workouts frequently, did that today after he had the episode with no problems specifically no DOE or chest pain.  When asked about the stress, reports he is a stress has been high for the last few weeks.   Review of Systems No lower extremity edema. He flew to [place] 4 weeks ago but otherwise no prolonged trips. No headache, dizziness, diplopia, slurred speech No recent abdominal pain, nausea or blood in the stools  Past Medical History:  Diagnosis Date  . GERD (gastroesophageal reflux disease)    diet controlled   . Hyperlipidemia   . Melanocytic nevus of trunk 2016   Compound Lentiginous type, on left mid medial chest    Past Surgical History:  Procedure Laterality Date  . LASIK    . SKIN LESION EXCISION     cyst, R hip  . TONSILLECTOMY AND ADENOIDECTOMY  1980s  . VASECTOMY  2016   Dr Amalia Hailey    Social History   Socioeconomic History  . Marital status: Married    Spouse name: Not on file  . Number of children: 3  . Years of education: Not on file  . Highest education level: Not on file  Social Needs  . Financial resource strain: Not on file  . Food insecurity - worry: Not on file  . Food insecurity - inability: Not on file  . Transportation needs - medical: Not on file  . Transportation needs - non-medical: Not on file  Occupational History  . Occupation: Armed forces operational officer   Tobacco Use  . Smoking status: Never Smoker  .  Smokeless tobacco: Never Used  Substance and Sexual Activity  . Alcohol use: Yes    Alcohol/week: 2.4 oz    Types: 4 Cans of beer per week    Comment: socially   . Drug use: No  . Sexual activity: Not on file  Other Topics Concern  . Not on file  Social History Narrative   Household-- wife and 3 children last born 02-2014      Allergies as of 03/04/2017      Reactions   Penicillins    Has patient had a PCN reaction causing immediate rash, facial/tongue/throat swelling, SOB or lightheadedness with hypotension: N Has patient had a PCN reaction causing severe rash involving mucus membranes or skin necrosis: No Has patient had a PCN reaction that required hospitalization No Has patient had a PCN reaction occurring within the last 10 years: No If all of the above answers are "NO", then may proceed with Cephalosporin use. CHILDHOOD ALLERGY -PATIENT CANNOT CONFIRM ANY OF THE REACTION      Medication List        Accurate as of 03/04/17 11:59 PM. Always use your most recent med list.          tobramycin 0.3 % ophthalmic solution Commonly known as:  TOBREX Place 1 drop into both eyes every 6 (six) hours.  Objective:   Physical Exam BP 122/80 (BP Location: Left Arm, Patient Position: Sitting, Cuff Size: Normal)   Pulse 68   Temp 97.8 F (36.6 C) (Oral)   Resp 14   Ht 5\' 8"  (1.727 m)   Wt 171 lb 8 oz (77.8 kg)   SpO2 98%   BMI 26.08 kg/m  General:   Well developed, well nourished . NAD.  HEENT:  Normocephalic . Face symmetric, atraumatic Lungs:  CTA B Normal respiratory effort, no intercostal retractions, no accessory muscle use. Heart: RRR,  no murmur.  no pretibial edema bilaterally Calves symmetric Abdomen:  Not distended, soft, non-tender. No rebound or rigidity.   Skin: Not pale. Not jaundice Neurologic:  alert & oriented X3.  Speech normal, gait appropriate for age and unassisted Psych--  Cognition and judgment appear intact.  Cooperative with  normal attention span and concentration.  Behavior appropriate. No anxious or depressed appearing.     Assessment & Plan:     Assessment Hyperlipidemia GERD Compound Lentiginous type, on left mid medial chest  PLAN:  Palpitations, shortness of breath, chest tightness: Patient is 41, extremely active, exercise very hard without symptoms, with no FH of CAD, LDL in the 150s, non-smoker, presents with above symptoms. EKG today sinus rhythm, no different from previous except the PR is slightly increased. We talked about observation versus further workup, he would like some workup.  We agreed on do a BMP, CBC, d-dimer stat to rule out the remote possibility of a clot. If d-dimer is positive, he understands this is a screening and he will be recommended to go to the ER to get further testing. ER if symptoms severe or persistent Anxiety: That seems to be a major problem to the patient, he recognizes that he is obsessed about certain problems and "I always find something to worry about".  Counseling is recommended, at some point he may like to consider medications.  He knows to keep me posted.

## 2017-03-07 DIAGNOSIS — F419 Anxiety disorder, unspecified: Secondary | ICD-10-CM | POA: Insufficient documentation

## 2017-03-07 NOTE — Assessment & Plan Note (Signed)
Palpitations, shortness of breath, chest tightness: Patient is 41, extremely active, exercise very hard without symptoms, with no FH of CAD, LDL in the 150s, non-smoker, presents with above symptoms. EKG today sinus rhythm, no different from previous except the PR is slightly increased. We talked about observation versus further workup, he would like some workup.  We agreed on do a BMP, CBC, d-dimer stat to rule out the remote possibility of a clot. If d-dimer is positive, he understands this is a screening and he will be recommended to go to the ER to get further testing. ER if symptoms severe or persistent Anxiety: That seems to be a major problem to the patient, he recognizes that he is obsessed about certain problems and "I always find something to worry about".  Counseling is recommended, at some point he may like to consider medications.  He knows to keep me posted.

## 2017-03-10 ENCOUNTER — Telehealth: Payer: Self-pay | Admitting: *Deleted

## 2017-03-10 NOTE — Telephone Encounter (Signed)
Received results from [facility]; forwarded to provider/SLS 12/27

## 2017-03-11 ENCOUNTER — Telehealth: Payer: Self-pay | Admitting: Internal Medicine

## 2017-03-11 MED ORDER — OSELTAMIVIR PHOSPHATE 75 MG PO CAPS: 75.0000 mg | ORAL_CAPSULE | Freq: Every day | ORAL | 0 refills | Status: DC

## 2017-03-11 NOTE — Telephone Encounter (Signed)
Please advise 

## 2017-03-11 NOTE — Telephone Encounter (Signed)
Spoke w/ Pt, informed that Rx sent to [facility]- informed to be seen if symptoms surface. Pt verbalized understanding.

## 2017-03-11 NOTE — Telephone Encounter (Signed)
Copied from [place]. Topic: Quick Communication - See Telephone Encounter >> Mar 11, 2017  2:58 PM Antonieta Iba C wrote:   CRM for notification. See Telephone encounter for: pt called in to request Tamiflu. He said that his daughter tested positive for the flu.    Ph[facility]: [facility] [address], [place]  03/11/17.

## 2017-03-11 NOTE — Telephone Encounter (Signed)
Call in Tamiflu 75 mg 1 p.o. daily #10 no refills

## 2017-05-12 ENCOUNTER — Encounter: Payer: BLUE CROSS/BLUE SHIELD | Admitting: Internal Medicine

## 2017-09-26 IMAGING — US US SCROTUM
1 series · 14 of 25 positions shown · non-contrast
Comparison: [DATE].

CLINICAL DATA: Left groin swelling pain 3 days.

EXAM:
SCROTAL ULTRASOUND
DOPPLER ULTRASOUND OF THE TESTICLES
TECHNIQUE: Complete ultrasound examination of the testicles, epididymis, and
other scrotal structures was performed. Color and spectral Doppler
ultrasound were also utilized to evaluate blood flow to the
testicles.

[Series 1: us scrotum · 0.07mm/px · 14 of 72 slices shown]
[im 1/72]
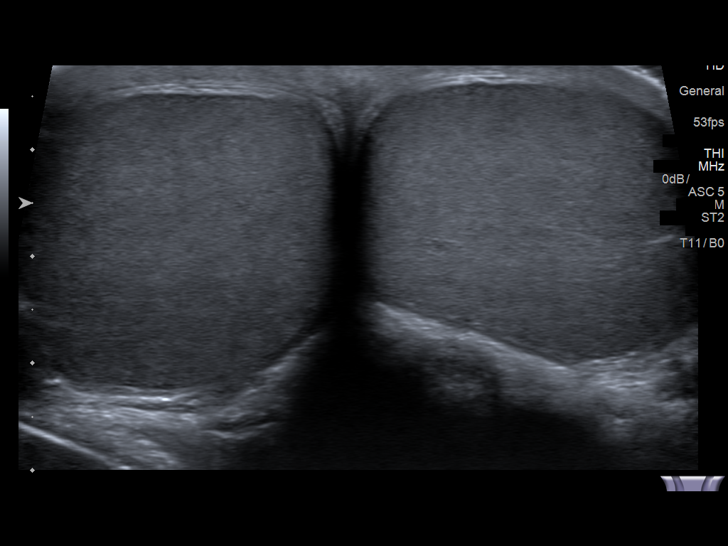
[im 6/72]
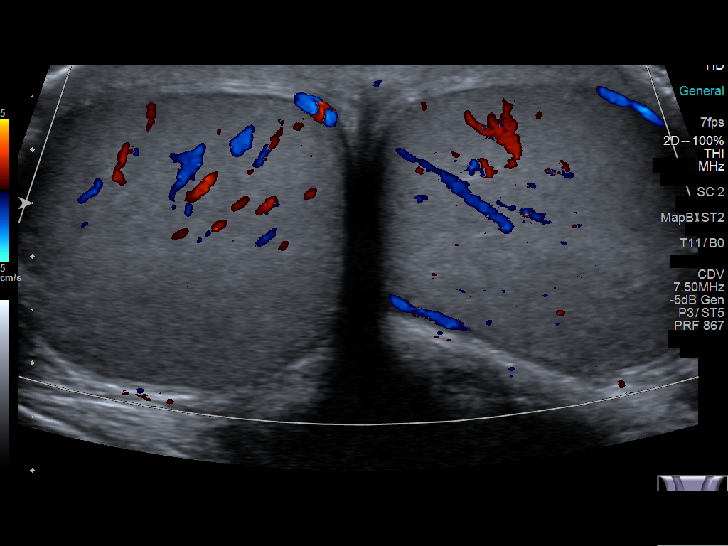
[im 12/72]
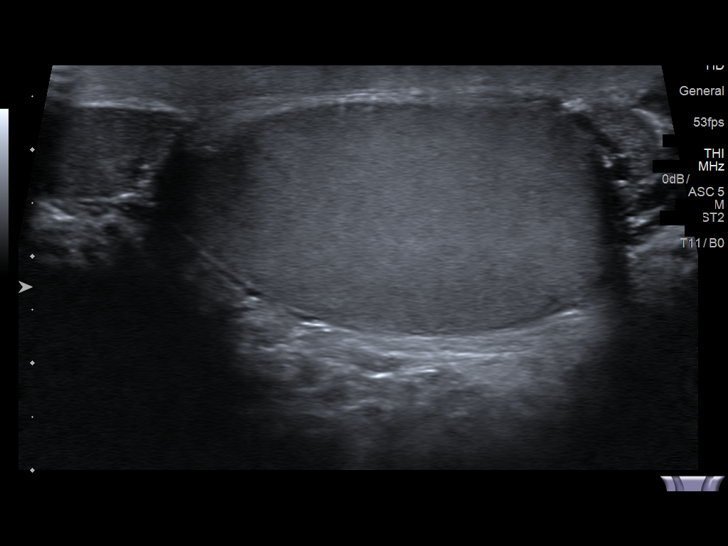
[im 18/72]
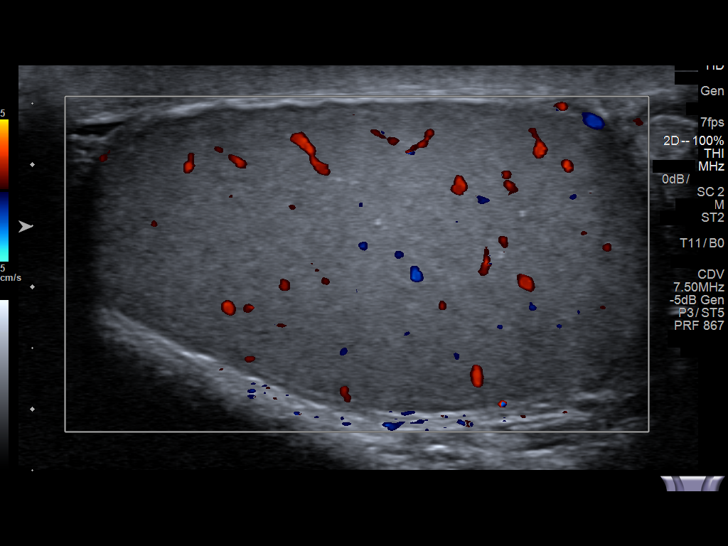
[im 24/72]
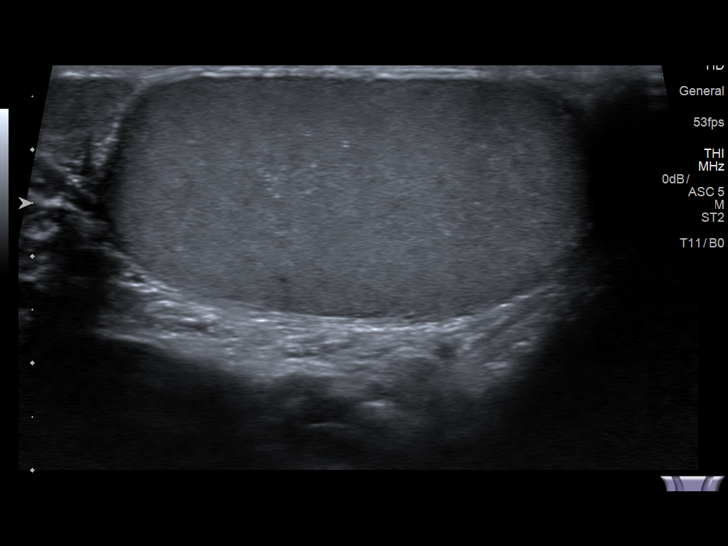
[im 27/72]
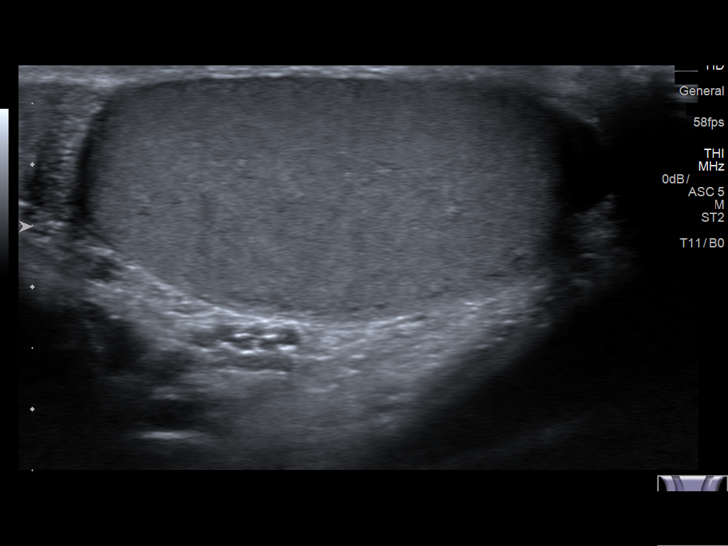
[im 33/72]
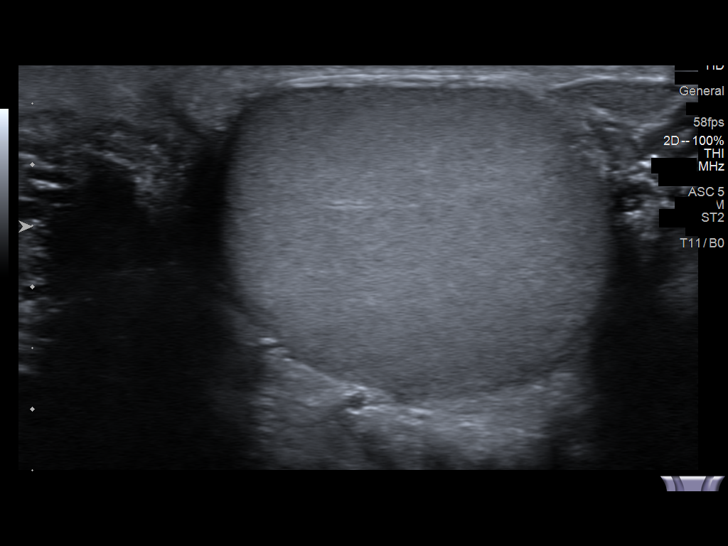
[im 39/72]
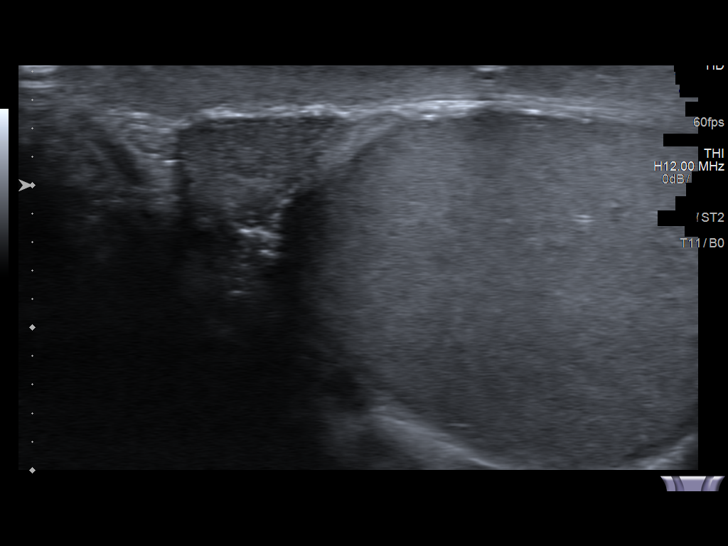
[im 45/72]
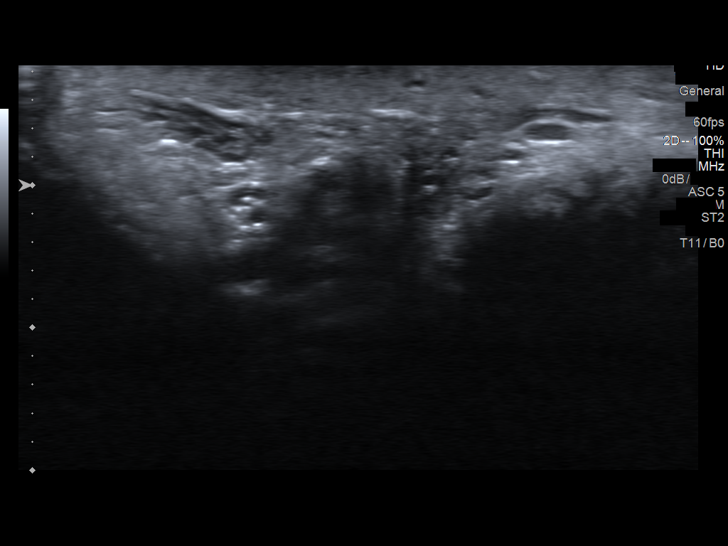
[im 48/72]
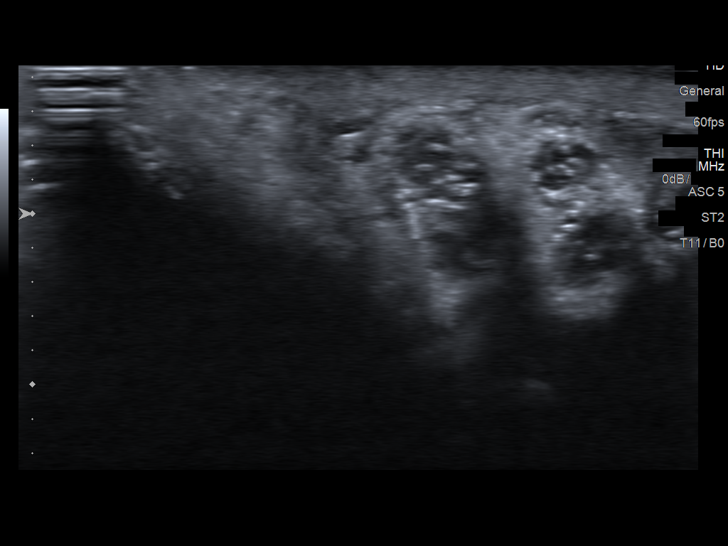
[im 54/72]
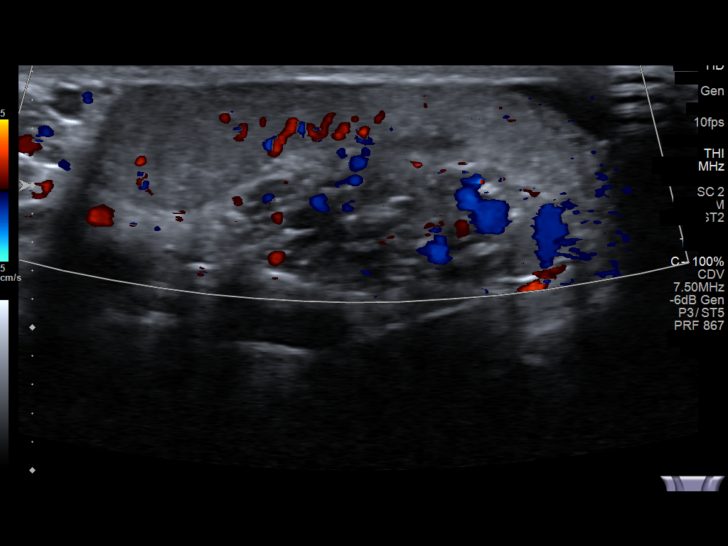
[im 60/72]
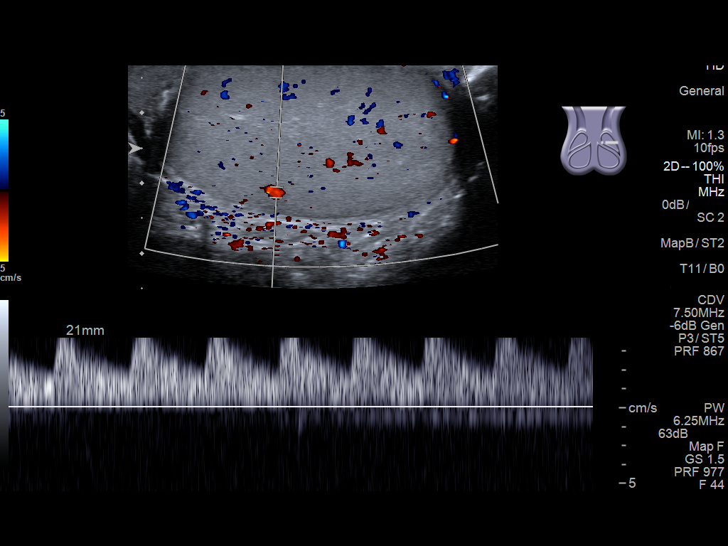
[im 66/72]
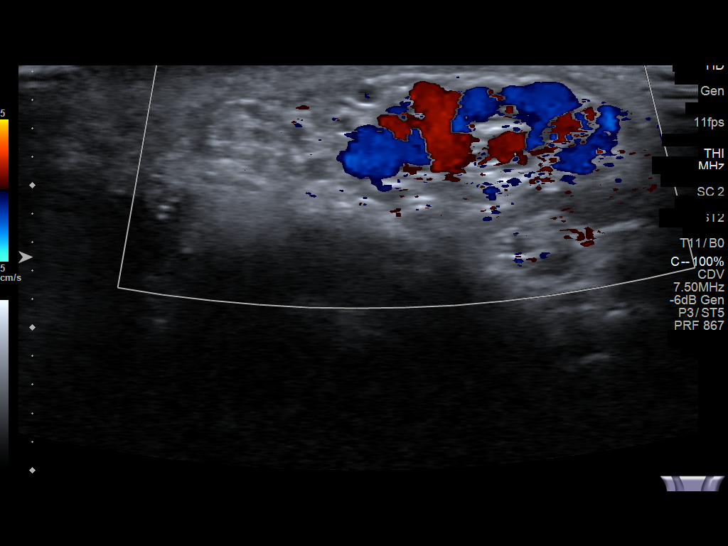
[im 72/72]
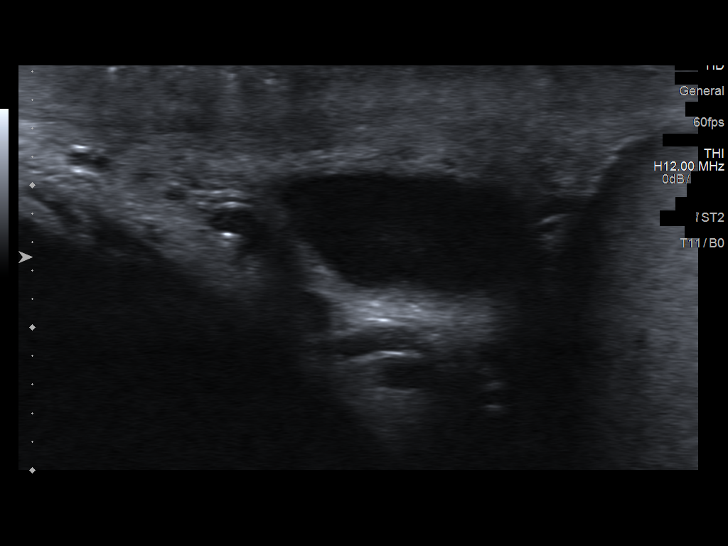

[14 of 25 positions shown; findings below may reference images not displayed]

FINDINGS: Right testicle

Measurements: 4.5 x 2.1 x 3.6 cm. No mass or microlithiasis
visualized.

Left testicle

Measurements: 4.4 x 2.4 x 3.1 cm. No mass or microlithiasis
visualized.

Right epididymis:  Normal in size and appearance.

Left epididymis:  Normal in size and appearance.

Hydrocele:  Bilateral hydroceles left side greater than right.

Varicocele:  Small left varicocele .

Pulsed Doppler interrogation of both testes demonstrates normal low
resistance arterial and venous waveforms bilaterally.
IMPRESSION: 1. Bilateral hydroceles left side greater than right. Small left
varicocele.

2.  No other focal abnormality identified .

## 2017-12-29 ENCOUNTER — Encounter: Payer: BLUE CROSS/BLUE SHIELD | Admitting: Internal Medicine

## 2018-01-06 ENCOUNTER — Ambulatory Visit (INDEPENDENT_AMBULATORY_CARE_PROVIDER_SITE_OTHER): Payer: PRIVATE HEALTH INSURANCE | Admitting: Internal Medicine

## 2018-01-06 ENCOUNTER — Encounter: Payer: Self-pay | Admitting: Internal Medicine

## 2018-01-06 VITALS — BP 124/74 | HR 67 | Temp 97.8°F | Resp 16 | Ht 68.0 in | Wt 168.0 lb

## 2018-01-06 DIAGNOSIS — Z0000 Encounter for general adult medical examination without abnormal findings: Secondary | ICD-10-CM | POA: Diagnosis not present

## 2018-01-06 NOTE — Progress Notes (Signed)
Subjective:    Patient ID: William Waters, male    DOB: 1975/10/03, 42 y.o.   MRN: [id number]  DOS:  01/06/2018 Type of visit - description : cpx Interval history: Since the last office he said he is doing well. He spent 6 months in [place] working, returned home ~ 2 weeks ago. Occasionally has palpitations (see last visit) he thinks that is related to anxiety.   Review of Systems  Other than above, a 14 point review of systems is negative     Past Medical History:  Diagnosis Date  . GERD (gastroesophageal reflux disease)    diet controlled   . Hyperlipidemia   . Melanocytic nevus of trunk 2016   Compound Lentiginous type, on left mid medial chest    Past Surgical History:  Procedure Laterality Date  . LASIK    . SKIN LESION EXCISION     cyst, R hip  . TONSILLECTOMY AND ADENOIDECTOMY  1980s  . VASECTOMY  2016   Dr Amalia Hailey    Social History   Socioeconomic History  . Marital status: Married    Spouse name: Not on file  . Number of children: 3  . Years of education: Not on file  . Highest education level: Not on file  Occupational History  . Occupation: Armed forces operational officer   Social Needs  . Financial resource strain: Not on file  . Food insecurity:    Worry: Not on file    Inability: Not on file  . Transportation needs:    Medical: Not on file    Non-medical: Not on file  Tobacco Use  . Smoking status: Never Smoker  . Smokeless tobacco: Never Used  Substance and Sexual Activity  . Alcohol use: Yes    Alcohol/week: 4.0 standard drinks    Types: 4 Cans of beer per week    Comment: socially   . Drug use: No  . Sexual activity: Not on file  Lifestyle  . Physical activity:    Days per week: Not on file    Minutes per session: Not on file  . Stress: Not on file  Relationships  . Social connections:    Talks on phone: Not on file    Gets together: Not on file    Attends religious service: Not on file    Active member of club or organization: Not on file      Attends meetings of clubs or organizations: Not on file    Relationship status: Not on file  . Intimate partner violence:    Fear of current or ex partner: Not on file    Emotionally abused: Not on file    Physically abused: Not on file    Forced sexual activity: Not on file  Other Topics Concern  . Not on file  Social History Narrative   Household-- wife and 3 children last born 02-2014     Family History  Problem Relation Age of Onset  . Hyperlipidemia Father   . Hypertension Father   . Colitis Father   . Hyperlipidemia Mother   . Breast cancer Maternal Grandfather        GF  . Colon cancer Neg Hx   . CAD Neg Hx      Allergies as of 01/06/2018      Reactions   Penicillins    Has patient had a PCN reaction causing immediate rash, facial/tongue/throat swelling, SOB or lightheadedness with hypotension: N Has patient had a PCN reaction causing severe rash  involving mucus membranes or skin necrosis: No Has patient had a PCN reaction that required hospitalization No Has patient had a PCN reaction occurring within the last 10 years: No If all of the above answers are "NO", then may proceed with Cephalosporin use. CHILDHOOD ALLERGY -PATIENT CANNOT CONFIRM ANY OF THE REACTION      Medication List    as of 01/06/2018  2:12 PM   You have not been prescribed any medications.        Objective:   Physical Exam BP 124/74 (BP Location: Left Arm, Patient Position: Sitting, Cuff Size: Normal)   Pulse 67   Temp 97.8 F (36.6 C) (Oral)   Resp 16   Ht 5\' 8"  (1.727 m)   Wt 168 lb (76.2 kg)   SpO2 98%   BMI 25.54 kg/m   General: Well developed, NAD, see BMI.  Neck: No  thyromegaly  HEENT:  Normocephalic . Face symmetric, atraumatic Lungs:  CTA B Normal respiratory effort, no intercostal retractions, no accessory muscle use. Heart: RRR,  no murmur.  No pretibial edema bilaterally  Abdomen:  Not distended, soft, non-tender. No rebound or rigidity.   Skin: Exposed  areas without rash. Not pale. Not jaundice Neurologic:  alert & oriented X3.  Speech normal, gait appropriate for age and unassisted Strength symmetric and appropriate for age.  Psych: Cognition and judgment appear intact.  Cooperative with normal attention span and concentration.  Behavior appropriate. No anxious or depressed appearing.     Assessment & Plan:   Assessment Hyperlipidemia GERD Anxiety  Compound Lentiginous type, on left mid medial chest  PLAN:  Hyperlipidemia: Diet controlled, 10-year cardiovascular risk factor is 1.6 %.  We are checking labs today, he is not fasting. Palpitations: See last visit, still has occasional symptoms, he thinks is related to anxiety. Anxiety: We had a conversation about it, there is room for improvement on his anxiety management skills, recommend to see a counselor, see AVS. RTC 1 year

## 2018-01-06 NOTE — Assessment & Plan Note (Signed)
Td 2013; plans to get a flu shot in 2 weeks, he is going on vacation in few days. -Continue with his healthy lifestyle -Labs: CMP, FLP, CBC, TSH RTC 1 year

## 2018-01-06 NOTE — Patient Instructions (Signed)
GO TO THE LAB : Get the blood work     GO TO THE FRONT DESK Schedule your next appointment for a physical exam in 1 year.  Consider seeing a counselor William Waters

## 2018-01-07 LAB — COMPREHENSIVE METABOLIC PANEL
AG RATIO: 2.2 (calc) (ref 1.0–2.5)
ALKALINE PHOSPHATASE (APISO): 80 U/L (ref 40–115)
ALT: 37 U/L (ref 9–46)
AST: 32 U/L (ref 10–40)
Albumin: 4.6 g/dL (ref 3.6–5.1)
BUN: 14 mg/dL (ref 7–25)
CALCIUM: 9.3 mg/dL (ref 8.6–10.3)
CO2: 25 mmol/L (ref 20–32)
Chloride: 101 mmol/L (ref 98–110)
Creat: 1.02 mg/dL (ref 0.60–1.35)
Globulin: 2.1 g/dL (calc) (ref 1.9–3.7)
Glucose, Bld: 83 mg/dL (ref 65–99)
Potassium: 4.2 mmol/L (ref 3.5–5.3)
SODIUM: 137 mmol/L (ref 135–146)
Total Bilirubin: 1 mg/dL (ref 0.2–1.2)
Total Protein: 6.7 g/dL (ref 6.1–8.1)

## 2018-01-07 LAB — CBC WITH DIFFERENTIAL/PLATELET
BASOS ABS: 52 {cells}/uL (ref 0–200)
Basophils Relative: 1 %
EOS ABS: 52 {cells}/uL (ref 15–500)
Eosinophils Relative: 1 %
HCT: 38.3 % — ABNORMAL LOW (ref 38.5–50.0)
Hemoglobin: 13.4 g/dL (ref 13.2–17.1)
Lymphs Abs: 1461 cells/uL (ref 850–3900)
MCH: 29.4 pg (ref 27.0–33.0)
MCHC: 35 g/dL (ref 32.0–36.0)
MCV: 84 fL (ref 80.0–100.0)
MONOS PCT: 8 %
MPV: 10.4 fL (ref 7.5–12.5)
NEUTROS ABS: 3219 {cells}/uL (ref 1500–7800)
NEUTROS PCT: 61.9 %
Platelets: 225 10*3/uL (ref 140–400)
RBC: 4.56 10*6/uL (ref 4.20–5.80)
RDW: 12.7 % (ref 11.0–15.0)
Total Lymphocyte: 28.1 %
WBC mixed population: 416 cells/uL (ref 200–950)
WBC: 5.2 10*3/uL (ref 3.8–10.8)

## 2018-01-07 LAB — LIPID PANEL
CHOLESTEROL: 264 mg/dL — AB (ref <200)
HDL: 70 mg/dL (ref >40)
LDL CHOLESTEROL (CALC): 157 mg/dL — AB
Non-HDL Cholesterol (Calc): 194 mg/dL (calc) — ABNORMAL HIGH (ref <130)
TRIGLYCERIDES: 209 mg/dL — AB (ref <150)
Total CHOL/HDL Ratio: 3.8 (calc) (ref <5.0)

## 2018-01-07 LAB — TSH: TSH: 0.79 m[IU]/L (ref 0.40–4.50)

## 2018-01-08 NOTE — Assessment & Plan Note (Signed)
Hyperlipidemia: Diet controlled, 10-year cardiovascular risk factor is 1.6 %.  We are checking labs today, he is not fasting. Palpitations: See last visit, still has occasional symptoms, he thinks is related to anxiety. Anxiety: We had a conversation about it, there is room for improvement on his anxiety management skills, recommend to see a counselor, see AVS. RTC 1 year

## 2018-07-09 ENCOUNTER — Encounter: Payer: Self-pay | Admitting: Internal Medicine

## 2018-07-25 ENCOUNTER — Encounter: Payer: Self-pay | Admitting: Internal Medicine

## 2018-07-25 NOTE — Telephone Encounter (Signed)
Pt had requested several weeks ago if we have coronavirus antibody testing at the time we didn't but wanted to be informed when we started testing. Please advise.

## 2018-10-31 ENCOUNTER — Telehealth: Payer: PRIVATE HEALTH INSURANCE | Admitting: Family

## 2018-10-31 DIAGNOSIS — Z20822 Contact with and (suspected) exposure to covid-19: Secondary | ICD-10-CM

## 2018-10-31 NOTE — Progress Notes (Signed)
E-Visit for Corona Virus Screening   Your current symptoms could be consistent with the coronavirus.  Many health care providers can now test patients at their office but not all are.  [facility] has multiple testing sites. For information on our COVID testing locations and hours go to [email or web address]  Please quarantine yourself while awaiting your test results.  We are enrolling you in our [facility] for [place] . Daily you will receive a questionnaire within the [facility] website. Our COVID 19 response team willl be monitoriing your responses daily.    COVID-19 is a respiratory illness with symptoms that are similar to the flu. Symptoms are typically mild to moderate, but there have been cases of severe illness and death due to the virus. The following symptoms may appear 2-14 days after exposure: . Fever . Cough . Shortness of breath or difficulty breathing . Chills . Repeated shaking with chills . Muscle pain . Headache . Sore throat . New loss of taste or smell . Fatigue . Congestion or runny nose . Nausea or vomiting . Diarrhea  It is vitally important that if you feel that you have an infection such as this virus or any other virus that you stay home and away from places where you may spread it to others.  You should self-quarantine for 14 days if you have symptoms that could potentially be coronavirus or have been in close contact a with a person diagnosed with COVID-19 within the last 2 weeks. You should avoid contact with people age 5 and older.   You should wear a mask or cloth face covering over your nose and mouth if you must be around other people or animals, including pets (even at home). Try to stay at least 6 feet away from other people. This will protect the people around you.  You may also take acetaminophen (Tylenol) as needed for fever.   Reduce your risk of any infection by using the same precautions used for avoiding the  common cold or flu:  Marland Kitchen Wash your hands often with soap and warm water for at least 20 seconds.  If soap and water are not readily available, use an alcohol-based hand sanitizer with at least 60% alcohol.  . If coughing or sneezing, cover your mouth and nose by coughing or sneezing into the elbow areas of your shirt or coat, into a tissue or into your sleeve (not your hands). . Avoid shaking hands with others and consider head nods or verbal greetings only. . Avoid touching your eyes, nose, or mouth with unwashed hands.  . Avoid close contact with people who are sick. . Avoid places or events with large numbers of people in one location, like concerts or sporting events. . Carefully consider travel plans you have or are making. . If you are planning any travel outside or inside the [place], visit the CDC's Travelers' Health webpage for the latest health notices. . If you have some symptoms but not all symptoms, continue to monitor at home and seek medical attention if your symptoms worsen. . If you are having a medical emergency, call 911.  HOME CARE . Only take medications as instructed by your medical team. . Drink plenty of fluids and get plenty of rest. . A steam or ultrasonic humidifier can help if you have congestion.   GET HELP RIGHT AWAY IF YOU HAVE EMERGENCY WARNING SIGNS** FOR COVID-19. If you or someone is showing any of these signs seek emergency medical care immediately. Call  911 or proceed to your closest emergency facility if: . You develop worsening high fever. . Trouble breathing . Bluish lips or face . Persistent pain or pressure in the chest . New confusion . Inability to wake or stay awake . You cough up blood. . Your symptoms become more severe  **This list is not all possible symptoms. Contact your medical provider for any symptoms that are sever or concerning to you.   MAKE SURE YOU   Understand these instructions.  Will watch your condition.  Will get help right  away if you are not doing well or get worse.  Your e-visit answers were reviewed by a board certified advanced clinical practitioner to complete your personal care plan.  Depending on the condition, your plan could have included both over the counter or prescription medications.  If there is a problem please reply once you have received a response from your provider.  Your safety is important to [place].  If you have drug allergies check your prescription carefully.    You can use MyChart to ask questions about today's visit, request a non-urgent call back, or ask for a work or school excuse for 24 hours related to this e-Visit. If it has been greater than 24 hours you will need to follow up with your provider, or enter a new e-Visit to address those concerns. You will get an e-mail in the next two days asking about your experience.  I hope that your e-visit has been valuable and will speed your recovery. Thank you for using e-visits.   Greater than 5 minutes, yet less than 10 minutes of time have been spent researching, coordinating, and implementing care for this patient today.  Thank you for the details you included in the comment boxes. Those details are very helpful in determining the best course of treatment for you and help [place] to provide the best care.

## 2019-01-09 ENCOUNTER — Ambulatory Visit (INDEPENDENT_AMBULATORY_CARE_PROVIDER_SITE_OTHER): Payer: BC Managed Care – PPO | Admitting: Internal Medicine

## 2019-01-09 ENCOUNTER — Other Ambulatory Visit: Payer: Self-pay

## 2019-01-09 ENCOUNTER — Encounter: Payer: Self-pay | Admitting: Internal Medicine

## 2019-01-09 VITALS — BP 117/64 | HR 69 | Temp 97.5°F | Resp 16 | Ht 68.0 in | Wt 177.4 lb

## 2019-01-09 DIAGNOSIS — Z0000 Encounter for general adult medical examination without abnormal findings: Secondary | ICD-10-CM | POA: Diagnosis not present

## 2019-01-09 DIAGNOSIS — Z23 Encounter for immunization: Secondary | ICD-10-CM | POA: Diagnosis not present

## 2019-01-09 LAB — CBC WITH DIFFERENTIAL/PLATELET
Basophils Absolute: 0 10*3/uL (ref 0.0–0.1)
Basophils Relative: 0.6 % (ref 0.0–3.0)
Eosinophils Absolute: 0 10*3/uL (ref 0.0–0.7)
Eosinophils Relative: 0.4 % (ref 0.0–5.0)
HCT: 37.9 % — ABNORMAL LOW (ref 39.0–52.0)
Hemoglobin: 13.3 g/dL (ref 13.0–17.0)
Lymphocytes Relative: 25.8 % (ref 12.0–46.0)
Lymphs Abs: 1.5 10*3/uL (ref 0.7–4.0)
MCHC: 35.2 g/dL (ref 30.0–36.0)
MCV: 84.1 fl (ref 78.0–100.0)
Monocytes Absolute: 0.4 10*3/uL (ref 0.1–1.0)
Monocytes Relative: 6.1 % (ref 3.0–12.0)
Neutro Abs: 4 10*3/uL (ref 1.4–7.7)
Neutrophils Relative %: 67.1 % (ref 43.0–77.0)
Platelets: 230 10*3/uL (ref 150.0–400.0)
RBC: 4.5 Mil/uL (ref 4.22–5.81)
RDW: 12.7 % (ref 11.5–15.5)
WBC: 6 10*3/uL (ref 4.0–10.5)

## 2019-01-09 LAB — COMPREHENSIVE METABOLIC PANEL
ALT: 26 U/L (ref 0–53)
AST: 25 U/L (ref 0–37)
Albumin: 4.6 g/dL (ref 3.5–5.2)
Alkaline Phosphatase: 70 U/L (ref 39–117)
BUN: 12 mg/dL (ref 6–23)
CO2: 31 mEq/L (ref 19–32)
Calcium: 9.4 mg/dL (ref 8.4–10.5)
Chloride: 97 mEq/L (ref 96–112)
Creatinine, Ser: 0.95 mg/dL (ref 0.40–1.50)
GFR: 85.58 mL/min (ref >60.00)
Glucose, Bld: 83 mg/dL (ref 70–99)
Potassium: 4.5 mEq/L (ref 3.5–5.1)
Sodium: 134 mEq/L — ABNORMAL LOW (ref 135–145)
Total Bilirubin: 1.4 mg/dL — ABNORMAL HIGH (ref 0.2–1.2)
Total Protein: 6.7 g/dL (ref 6.0–8.3)

## 2019-01-09 LAB — LIPID PANEL
Cholesterol: 264 mg/dL — ABNORMAL HIGH (ref 0–200)
HDL: 65.8 mg/dL (ref >39.00)
NonHDL: 198.67
Total CHOL/HDL Ratio: 4
Triglycerides: 280 mg/dL — ABNORMAL HIGH (ref 0.0–149.0)
VLDL: 56 mg/dL — ABNORMAL HIGH (ref 0.0–40.0)

## 2019-01-09 LAB — LDL CHOLESTEROL, DIRECT: Direct LDL: 155 mg/dL

## 2019-01-09 NOTE — Progress Notes (Signed)
Subjective:    Patient ID: William Waters, male    DOB: 29-Dec-1975, 43 y.o.   MRN: [id number]  DOS:  01/09/2019 Type of visit - description: CPX Doing well, has no major concerns. Has been working a lot lately, diet has not been the best due to his work environment. He remains very active and exercises regularly  Review of Systems  Other than above, a 14 point review of systems is negative    Past Medical History:  Diagnosis Date  . GERD (gastroesophageal reflux disease)    diet controlled   . Hyperlipidemia   . Melanocytic nevus of trunk 2016   Compound Lentiginous type, on left mid medial chest    Past Surgical History:  Procedure Laterality Date  . LASIK    . SKIN LESION EXCISION     cyst, R hip  . TONSILLECTOMY AND ADENOIDECTOMY  1980s  . VASECTOMY  2016   Dr Amalia Hailey    Social History   Socioeconomic History  . Marital status: Married    Spouse name: Not on file  . Number of children: 3  . Years of education: Not on file  . Highest education level: Not on file  Occupational History  . Occupation:  Multiple business, feeds firefighters out [place]  . 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 Financial resource strain: Not on file  . Food insecurity    Worry: Not on file    Inability: Not on file  . Transportation needs    Medical: Not on file    Non-medical: Not on file  Tobacco Use  . Smoking status: Never Smoker  . Smokeless tobacco: Never Used  Substance and Sexual Activity  . Alcohol use: Yes    Alcohol/week: 4.0 standard drinks    Types: 4 Cans of beer per week    Comment: socially   . Drug use: No  . Sexual activity: Not on file  Lifestyle  . Physical activity    Days per week: Not on file    Minutes per session: Not on file  . Stress: Not on file  Relationships  . Social Herbalist on phone: Not on file    Gets together: Not on file    Attends religious service: Not on file    Active  member of club or organization: Not on file    Attends meetings of clubs or organizations: Not on file    Relationship status: Not on file  . Intimate partner violence    Fear of current or ex partner: Not on file    Emotionally abused: Not on file    Physically abused: Not on file    Forced sexual activity: Not on file  Other Topics Concern  . Not on file  Social History Narrative   Household:  wife and 3 children last born 02-2014   Family History  Problem Relation Age of Onset  . Hyperlipidemia Father   . Hypertension Father   . Colitis Father   . Hyperlipidemia Mother   . Breast cancer Maternal Grandfather        GF  . Colon cancer Neg Hx   . CAD Neg Hx      Allergies as of 01/09/2019      Reactions   Penicillins    Has patient had a PCN reaction causing immediate rash, facial/tongue/throat swelling, SOB or lightheadedness with hypotension: N Has patient had a  PCN reaction causing severe rash involving mucus membranes or skin necrosis: No Has patient had a PCN reaction that required hospitalization No Has patient had a PCN reaction occurring within the last 10 years: No If all of the above answers are "NO", then may proceed with Cephalosporin use. CHILDHOOD ALLERGY -PATIENT CANNOT CONFIRM ANY OF THE REACTION      Medication List    as of January 09, 2019 11:59 PM   You have not been prescribed any medications.         Objective:   Physical Exam BP 117/64 (BP Location: Left Arm, Patient Position: Sitting, Cuff Size: Normal)   Pulse 69   Temp (!) 97.5 F (36.4 C) (Temporal)   Resp 16   Ht 5\' 8"  (1.727 m)   Wt 177 lb 6 oz (80.5 kg)   SpO2 100%   BMI 26.97 kg/m  General: Well developed, NAD, BMI noted Neck: No  thyromegaly  HEENT:  Normocephalic . Face symmetric, atraumatic Lungs:  CTA B Normal respiratory effort, no intercostal retractions, no accessory muscle use. Heart: RRR,  no murmur.  No pretibial edema bilaterally  Abdomen:  Not distended,  soft, non-tender. No rebound or rigidity.   Skin: Exposed areas without rash. Not pale. Not jaundice Neurologic:  alert & oriented X3.  Speech normal, gait appropriate for age and unassisted Strength symmetric and appropriate for age.  Psych: Cognition and judgment appear intact.  Cooperative with normal attention span and concentration.  Behavior appropriate. No anxious or depressed appearing.     Assessment      Assessment Hyperlipidemia GERD Anxiety  Compound Lentiginous type, on left mid medial chest  PLAN:  Here for CPX Hyperlipidemia: Diet controlled, lately has not been able to eat healthy as he used to but is getting back on track.  He remains active.  He is not fasting but will go ahead and check a lipid panel.  If cholesterol is very different we might bring him back in few months to recheck. He is 10-year cardiovascular risk score is only 1.8. Moles: He is very good using sunscreens, recommend to see a dermatologist every 3 years for a check over. Anxiety: Not an issue at this time, his family is doing great RTC 1 year

## 2019-01-09 NOTE — Progress Notes (Signed)
Pre visit review using our clinic review tool, if applicable. No additional management support is needed unless otherwise documented below in the visit note. 

## 2019-01-09 NOTE — Assessment & Plan Note (Addendum)
Td 2013 - flu shot today. -Continue with his healthy lifestyle -Labs:  CBC, CMP, lipid

## 2019-01-09 NOTE — Patient Instructions (Addendum)
GO TO THE LAB : Get the blood work     GO TO THE FRONT DESK Schedule your next appointment   for physical exam in 1 year   You are not fasting, if your cholesterol is much different compared to last year,  we might ask you to come back fasting in few months to recheck  [email or web address]

## 2019-01-10 NOTE — Assessment & Plan Note (Signed)
Hyperlipidemia: Diet controlled, lately has not been able to eat healthy as he used to but is getting back on track.  He remains active.  He is not fasting but will go ahead and check a lipid panel.  If cholesterol is very different we might bring him back in few months to recheck. He is 10-year cardiovascular risk score is only 1.8. Moles: He is very good using sunscreens, recommend to see a dermatologist every 3 years for a check over. Anxiety: Not an issue at this time, his family is doing great RTC 1 year

## 2019-07-19 ENCOUNTER — Other Ambulatory Visit: Payer: Self-pay

## 2019-07-19 ENCOUNTER — Other Ambulatory Visit: Payer: Self-pay | Admitting: Nurse Practitioner

## 2019-07-19 ENCOUNTER — Ambulatory Visit
Admission: RE | Admit: 2019-07-19 | Discharge: 2019-07-19 | Disposition: A | Discharge status: Home / Self Care | Payer: BC Managed Care – PPO | Source: Ambulatory Visit | Attending: Nurse Practitioner | Admitting: Nurse Practitioner

## 2019-07-19 DIAGNOSIS — Z021 Encounter for pre-employment examination: Secondary | ICD-10-CM

## 2019-07-24 ENCOUNTER — Telehealth: Payer: Self-pay

## 2019-07-24 NOTE — Telephone Encounter (Signed)
Pt is applying to [facility] they are needing immunization record. Record faxed to [phone] ATTN: Yevonne Pax, NP.

## 2019-07-25 ENCOUNTER — Ambulatory Visit: Payer: Self-pay | Admitting: Internal Medicine

## 2020-01-11 ENCOUNTER — Encounter: Payer: BC Managed Care – PPO | Admitting: Internal Medicine

## 2020-05-19 ENCOUNTER — Encounter: Payer: Self-pay | Admitting: Internal Medicine

## 2020-05-19 ENCOUNTER — Ambulatory Visit: Payer: 59 | Admitting: Internal Medicine

## 2020-05-19 ENCOUNTER — Other Ambulatory Visit: Payer: Self-pay

## 2020-05-19 VITALS — BP 114/77 | HR 73 | Temp 98.3°F | Resp 12 | Ht 68.0 in | Wt 174.6 lb

## 2020-05-19 DIAGNOSIS — F5102 Adjustment insomnia: Secondary | ICD-10-CM

## 2020-05-19 DIAGNOSIS — E7800 Pure hypercholesterolemia, unspecified: Secondary | ICD-10-CM | POA: Diagnosis not present

## 2020-05-19 MED ORDER — ZOLPIDEM TARTRATE 10 MG PO TABS: 5.0000 mg | ORAL_TABLET | Freq: Every evening | ORAL | 1 refills | Status: DC | PRN

## 2020-05-19 NOTE — Patient Instructions (Signed)
Take zolpidem 10 mg: 1 or half tablet at bedtime as needed    [place], [place] Come back for a physical at your convenience

## 2020-05-19 NOTE — Progress Notes (Signed)
Subjective:    Patient ID: William Waters, male    DOB: 04-21-1975, 45 y.o.   MRN: [id number]  DOS:  05/19/2020 Type of visit - description: Acute Since the last office visit he changed jobs, working for [employer].   His new shift start at 4 PM and ends at 3 AM, anticipates he will have problems sleep and would like something to help.   Review of Systems Otherwise feels great, no anxiety, depression, some stress. He remains very active.  Past Medical History:  Diagnosis Date  . GERD (gastroesophageal reflux disease)    diet controlled   . Hyperlipidemia   . Melanocytic nevus of trunk 2016   Compound Lentiginous type, on left mid medial chest    Past Surgical History:  Procedure Laterality Date  . LASIK    . SKIN LESION EXCISION     cyst, R hip  . TONSILLECTOMY AND ADENOIDECTOMY  1980s  . VASECTOMY  2016   Dr Amalia Hailey    Allergies as of 05/19/2020      Reactions   Penicillins    Has patient had a PCN reaction causing immediate rash, facial/tongue/throat swelling, SOB or lightheadedness with hypotension: N Has patient had a PCN reaction causing severe rash involving mucus membranes or skin necrosis: No Has patient had a PCN reaction that required hospitalization No Has patient had a PCN reaction occurring within the last 10 years: No If all of the above answers are "NO", then may proceed with Cephalosporin use. CHILDHOOD ALLERGY -PATIENT CANNOT CONFIRM ANY OF THE REACTION      Medication List       Accurate as of May 19, 2020 11:59 PM. If you have any questions, ask your nurse or doctor.        zolpidem 10 MG tablet Commonly known as: AMBIEN Take 0.5-1 tablets (5-10 mg total) by mouth at bedtime as needed for sleep. Started by: Kathlene November, MD          Objective:   Physical Exam BP 114/77 (BP Location: Right Arm, Cuff Size: Large)   Pulse 73   Temp 98.3 F (36.8 C) (Oral)   Resp 12   Ht 5\' 8"  (1.727 m)   Wt 174 lb 9.6 oz (79.2 kg)   SpO2 98%    BMI 26.55 kg/m  General:   Well developed, NAD, BMI noted. HEENT:  Normocephalic . Face symmetric, atraumatic Lower extremities: no pretibial edema bilaterally  Skin: Not pale. Not jaundice Neurologic:  alert & oriented X3.  Speech normal, gait appropriate for age and unassisted Psych--  Cognition and judgment appear intact.  Cooperative with normal attention span and concentration.  Behavior appropriate. No anxious or depressed appearing.      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  We agreed to prescribe Ambien 5 mg or 10 mg as needed.  If not effective consider trazodone.  We will try to avoid benzos. Hyperlipidemia: Doing great with lifestyle, will check FLP when he comes back.  He is also considering a coronary calcium score or a similar test, he is concerned about his heart. RTC at his convenience for a physical exam fast   This visit occurred during the SARS-CoV-2 public health emergency.  Safety protocols were in place, including screening questions prior to the visit,  additional usage of staff PPE, and extensive cleaning of exam room while observing appropriate contact time as indicated for disinfecting solutions.

## 2020-05-20 NOTE — Assessment & Plan Note (Signed)
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  We agreed to prescribe Ambien 5 mg or 10 mg as needed.  If not effective consider trazodone.  We will try to avoid benzos. Hyperlipidemia: Doing great with lifestyle, will check FLP when he comes back.  He is also considering a coronary calcium score or a similar test, he is concerned about his heart. RTC at his convenience for a physical exam fast

## 2020-08-26 ENCOUNTER — Encounter: Payer: 59 | Admitting: Internal Medicine

## 2020-09-02 ENCOUNTER — Encounter: Payer: Self-pay | Admitting: Internal Medicine

## 2020-09-02 ENCOUNTER — Ambulatory Visit (INDEPENDENT_AMBULATORY_CARE_PROVIDER_SITE_OTHER): Payer: 59 | Admitting: Internal Medicine

## 2020-09-02 ENCOUNTER — Telehealth: Payer: Self-pay

## 2020-09-02 ENCOUNTER — Other Ambulatory Visit: Payer: Self-pay

## 2020-09-02 VITALS — BP 116/64 | HR 65 | Temp 97.8°F | Resp 16 | Ht 68.0 in | Wt 172.2 lb

## 2020-09-02 DIAGNOSIS — Z1159 Encounter for screening for other viral diseases: Secondary | ICD-10-CM | POA: Diagnosis not present

## 2020-09-02 DIAGNOSIS — E7800 Pure hypercholesterolemia, unspecified: Secondary | ICD-10-CM | POA: Diagnosis not present

## 2020-09-02 DIAGNOSIS — Z0000 Encounter for general adult medical examination without abnormal findings: Secondary | ICD-10-CM | POA: Diagnosis not present

## 2020-09-02 MED ORDER — JUBLIA 10 % EX SOLN: 1.0000 "application " | Freq: Every day | CUTANEOUS | 12 refills | Status: DC

## 2020-09-02 NOTE — Telephone Encounter (Signed)
Okay to change to ciclopirox, same instructions

## 2020-09-02 NOTE — Progress Notes (Signed)
Subjective:    Patient ID: William Waters, male    DOB: October 09, 1975, 45 y.o.   MRN: [id number]  DOS:  09/02/2020 Type of visit - description: CPX Here for CPX.  Doing great, has dystrophic nails for a while.  Treatment?  Review of Systems  Other than above, a 14 point review of systems is negative      Past Medical History:  Diagnosis Date   GERD (gastroesophageal reflux disease)    diet controlled    Hyperlipidemia    Melanocytic nevus of trunk 2016   Compound Lentiginous type, on left mid medial chest    Past Surgical History:  Procedure Laterality Date   LASIK     SKIN LESION EXCISION     cyst, R hip   TONSILLECTOMY AND ADENOIDECTOMY  1980s   VASECTOMY  2016   Dr Amalia Hailey   Social History   Socioeconomic History   Marital status: Married    Spouse name: Not on file   Number of children: 3   Years of education: Not on file   Highest education level: Not on file  Occupational History   Occupation:  Engineer, 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   Occupation: owns a Hotel manager, 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  Tobacco Use   Smoking status: Never   Smokeless tobacco: Never  Substance and Sexual Activity   Alcohol use: Yes    Alcohol/week: 4.0 standard drinks    Types: 4 Cans of beer per week    Comment: socially    Drug use: No   Sexual activity: Not on file  Other Topics Concern   Not on file  Social History Narrative   Household:  wife and 3 children last born 02-2014   Social Determinants of Health   Financial Resource Strain: Not on file  Food Insecurity: Not on file  Transportation Needs: Not on file  Physical Activity: Not on file  Stress: Not on file  Social Connections: Not on file  Intimate Partner Violence: Not on file    Allergies as of 09/02/2020       Reactions   Penicillins    Has patient had a PCN reaction causing immediate rash, facial/tongue/throat swelling, SOB or lightheadedness with hypotension: N Has patient had a PCN reaction causing  severe rash involving mucus membranes or skin necrosis: No Has patient had a PCN reaction that required hospitalization No Has patient had a PCN reaction occurring within the last 10 years: No If all of the above answers are "NO", then may proceed with Cephalosporin use. CHILDHOOD ALLERGY -PATIENT CANNOT CONFIRM ANY OF THE REACTION        Medication List        Accurate as of September 02, 2020 11:59 PM. If you have any questions, ask your nurse or doctor.          STOP taking these medications    zolpidem 10 MG tablet Commonly known as: AMBIEN Stopped by: Kathlene November, MD       TAKE these medications    ciclopirox 8 % solution Commonly known as: PENLAC Apply topically at bedtime. Apply over nail and surrounding skin. Apply daily over previous coat. After seven (7) days, may remove with alcohol and continue cycle. Started by: Bishop Dublin, CMA           Objective:   Physical Exam BP 116/64 (BP Location: Left Arm, Patient Position: Sitting, Cuff Size: Normal)   Pulse 65   Temp 97.8  F (36.6 C) (Oral)   Resp 16   Ht 5\' 8"  (1.727 m)   Wt 172 lb 4 oz (78.1 kg)   SpO2 97%   BMI 26.19 kg/m  General: Well developed, NAD, BMI noted Neck: No  thyromegaly  HEENT:  Normocephalic . Face symmetric, atraumatic Lungs:  CTA B Normal respiratory effort, no intercostal retractions, no accessory muscle use. Heart: RRR,  no murmur.  Abdomen:  Not distended, soft, non-tender. No rebound or rigidity.   Lower extremities: no pretibial edema bilaterally  Skin: Exposed areas without rash. Not pale. Not jaundice. Toenails: Few toenails all dystrophic at the endings. Neurologic:  alert & oriented X3.  Speech normal, gait appropriate for age and unassisted Strength symmetric and appropriate for age.  Psych: Cognition and judgment appear intact.  Cooperative with normal attention span and concentration.  Behavior appropriate. No anxious or depressed appearing.     Assessment     Assessment Hyperlipidemia GERD Anxiety  Compound Lentiginous type, on left mid medial chest  PLAN: Here for CPX, doing well  Hyperlipidemia: Diet controlled checking labs. Dystrophic nails: Likely onychomycosis, with talk about oral versus topical treatment, elected topical, see AVS. No other issues. RTC 1 year   This visit occurred during the SARS-CoV-2 public health emergency.  Safety protocols were in place, including screening questions prior to the visit, additional usage of staff PPE, and extensive cleaning of exam room while observing appropriate contact time as indicated for disinfecting solutions.

## 2020-09-02 NOTE — Patient Instructions (Signed)
Apply Jublia to your toenails every night.  Once a week, do deep cleaning with alcohol and a piece of cotton  We are referring you to the gastroenterologist for a colonoscopy. Please call [facility], PLEASE SCHEDULE YOUR APPOINTMENTS Come back for   blood work, fasting at your earliest convenience  Come back for physical exam in 1 year

## 2020-09-02 NOTE — Telephone Encounter (Signed)
Jublia not covered by insurance   Ciclopirox Not Required Ciclopirox Olamine Not Required Clotrimazole Not Required Exelderm Not Required Fluconazole Not Required Sulconazole Nitrate Not Required Tavaborole Not Required Terbinafine HCl Not Required   See list above for meds that are covered w/o PA.

## 2020-09-03 ENCOUNTER — Encounter: Payer: Self-pay | Admitting: Internal Medicine

## 2020-09-03 MED ORDER — CICLOPIROX 8 % EX SOLN: Freq: Every day | CUTANEOUS | 12 refills | Status: DC

## 2020-09-03 NOTE — Telephone Encounter (Signed)
Rx sent 

## 2020-09-03 NOTE — Assessment & Plan Note (Signed)
Here for CPX, doing well  Hyperlipidemia: Diet controlled checking labs. Dystrophic nails: Likely onychomycosis, with talk about oral versus topical treatment, elected topical, see AVS. No other issues. RTC 1 year

## 2020-09-03 NOTE — Assessment & Plan Note (Signed)
-   Td 2013 - covid vax x 2, rec shot #3 - CCS: had cscope 2017 d/t rectal bleed, wnl, next Cscope 10 years -Continue with his healthy lifestyle -Labs: CMP, FLP, CBC, hep C, TSH.

## 2020-09-05 ENCOUNTER — Other Ambulatory Visit: Payer: 59

## 2020-09-09 ENCOUNTER — Other Ambulatory Visit: Payer: Self-pay

## 2020-09-09 ENCOUNTER — Other Ambulatory Visit (INDEPENDENT_AMBULATORY_CARE_PROVIDER_SITE_OTHER): Payer: 59

## 2020-09-09 DIAGNOSIS — Z0000 Encounter for general adult medical examination without abnormal findings: Secondary | ICD-10-CM | POA: Diagnosis not present

## 2020-09-09 DIAGNOSIS — E7800 Pure hypercholesterolemia, unspecified: Secondary | ICD-10-CM | POA: Diagnosis not present

## 2020-09-09 DIAGNOSIS — Z1159 Encounter for screening for other viral diseases: Secondary | ICD-10-CM

## 2020-09-09 LAB — COMPREHENSIVE METABOLIC PANEL
ALT: 23 U/L (ref 0–53)
AST: 25 U/L (ref 0–37)
Albumin: 4.8 g/dL (ref 3.5–5.2)
Alkaline Phosphatase: 56 U/L (ref 39–117)
BUN: 16 mg/dL (ref 6–23)
CO2: 29 mEq/L (ref 19–32)
Calcium: 9.6 mg/dL (ref 8.4–10.5)
Chloride: 96 mEq/L (ref 96–112)
Creatinine, Ser: 1.08 mg/dL (ref 0.40–1.50)
GFR: 81.97 mL/min (ref >60.00)
Glucose, Bld: 94 mg/dL (ref 70–99)
Potassium: 4.4 mEq/L (ref 3.5–5.1)
Sodium: 133 mEq/L — ABNORMAL LOW (ref 135–145)
Total Bilirubin: 1.9 mg/dL — ABNORMAL HIGH (ref 0.2–1.2)
Total Protein: 6.9 g/dL (ref 6.0–8.3)

## 2020-09-09 LAB — CBC WITH DIFFERENTIAL/PLATELET
Basophils Absolute: 0 10*3/uL (ref 0.0–0.1)
Basophils Relative: 0.6 % (ref 0.0–3.0)
Eosinophils Absolute: 0.1 10*3/uL (ref 0.0–0.7)
Eosinophils Relative: 0.9 % (ref 0.0–5.0)
HCT: 39.6 % (ref 39.0–52.0)
Hemoglobin: 14 g/dL (ref 13.0–17.0)
Lymphocytes Relative: 23.6 % (ref 12.0–46.0)
Lymphs Abs: 1.3 10*3/uL (ref 0.7–4.0)
MCHC: 35.5 g/dL (ref 30.0–36.0)
MCV: 84 fl (ref 78.0–100.0)
Monocytes Absolute: 0.5 10*3/uL (ref 0.1–1.0)
Monocytes Relative: 9.1 % (ref 3.0–12.0)
Neutro Abs: 3.7 10*3/uL (ref 1.4–7.7)
Neutrophils Relative %: 65.8 % (ref 43.0–77.0)
Platelets: 230 10*3/uL (ref 150.0–400.0)
RBC: 4.71 Mil/uL (ref 4.22–5.81)
RDW: 13 % (ref 11.5–15.5)
WBC: 5.6 10*3/uL (ref 4.0–10.5)

## 2020-09-09 LAB — LIPID PANEL
Cholesterol: 227 mg/dL — ABNORMAL HIGH (ref 0–200)
HDL: 62.7 mg/dL (ref >39.00)
LDL Cholesterol: 144 mg/dL — ABNORMAL HIGH (ref 0–99)
NonHDL: 164.49
Total CHOL/HDL Ratio: 4
Triglycerides: 103 mg/dL (ref 0.0–149.0)
VLDL: 20.6 mg/dL (ref 0.0–40.0)

## 2020-09-09 LAB — TSH: TSH: 1.35 u[IU]/mL (ref 0.35–4.50)

## 2020-09-10 LAB — HEPATITIS C ANTIBODY
Hepatitis C Ab: NONREACTIVE
SIGNAL TO CUT-OFF: 0.02 (ref <1.00)

## 2020-10-30 IMAGING — CR DG CHEST 1V
1 series · 1 of 1 positions shown · non-contrast
Comparison: None.

CLINICAL DATA: Pre-employment physical examination

EXAM:
CHEST  1 VIEW

[w chest pa]
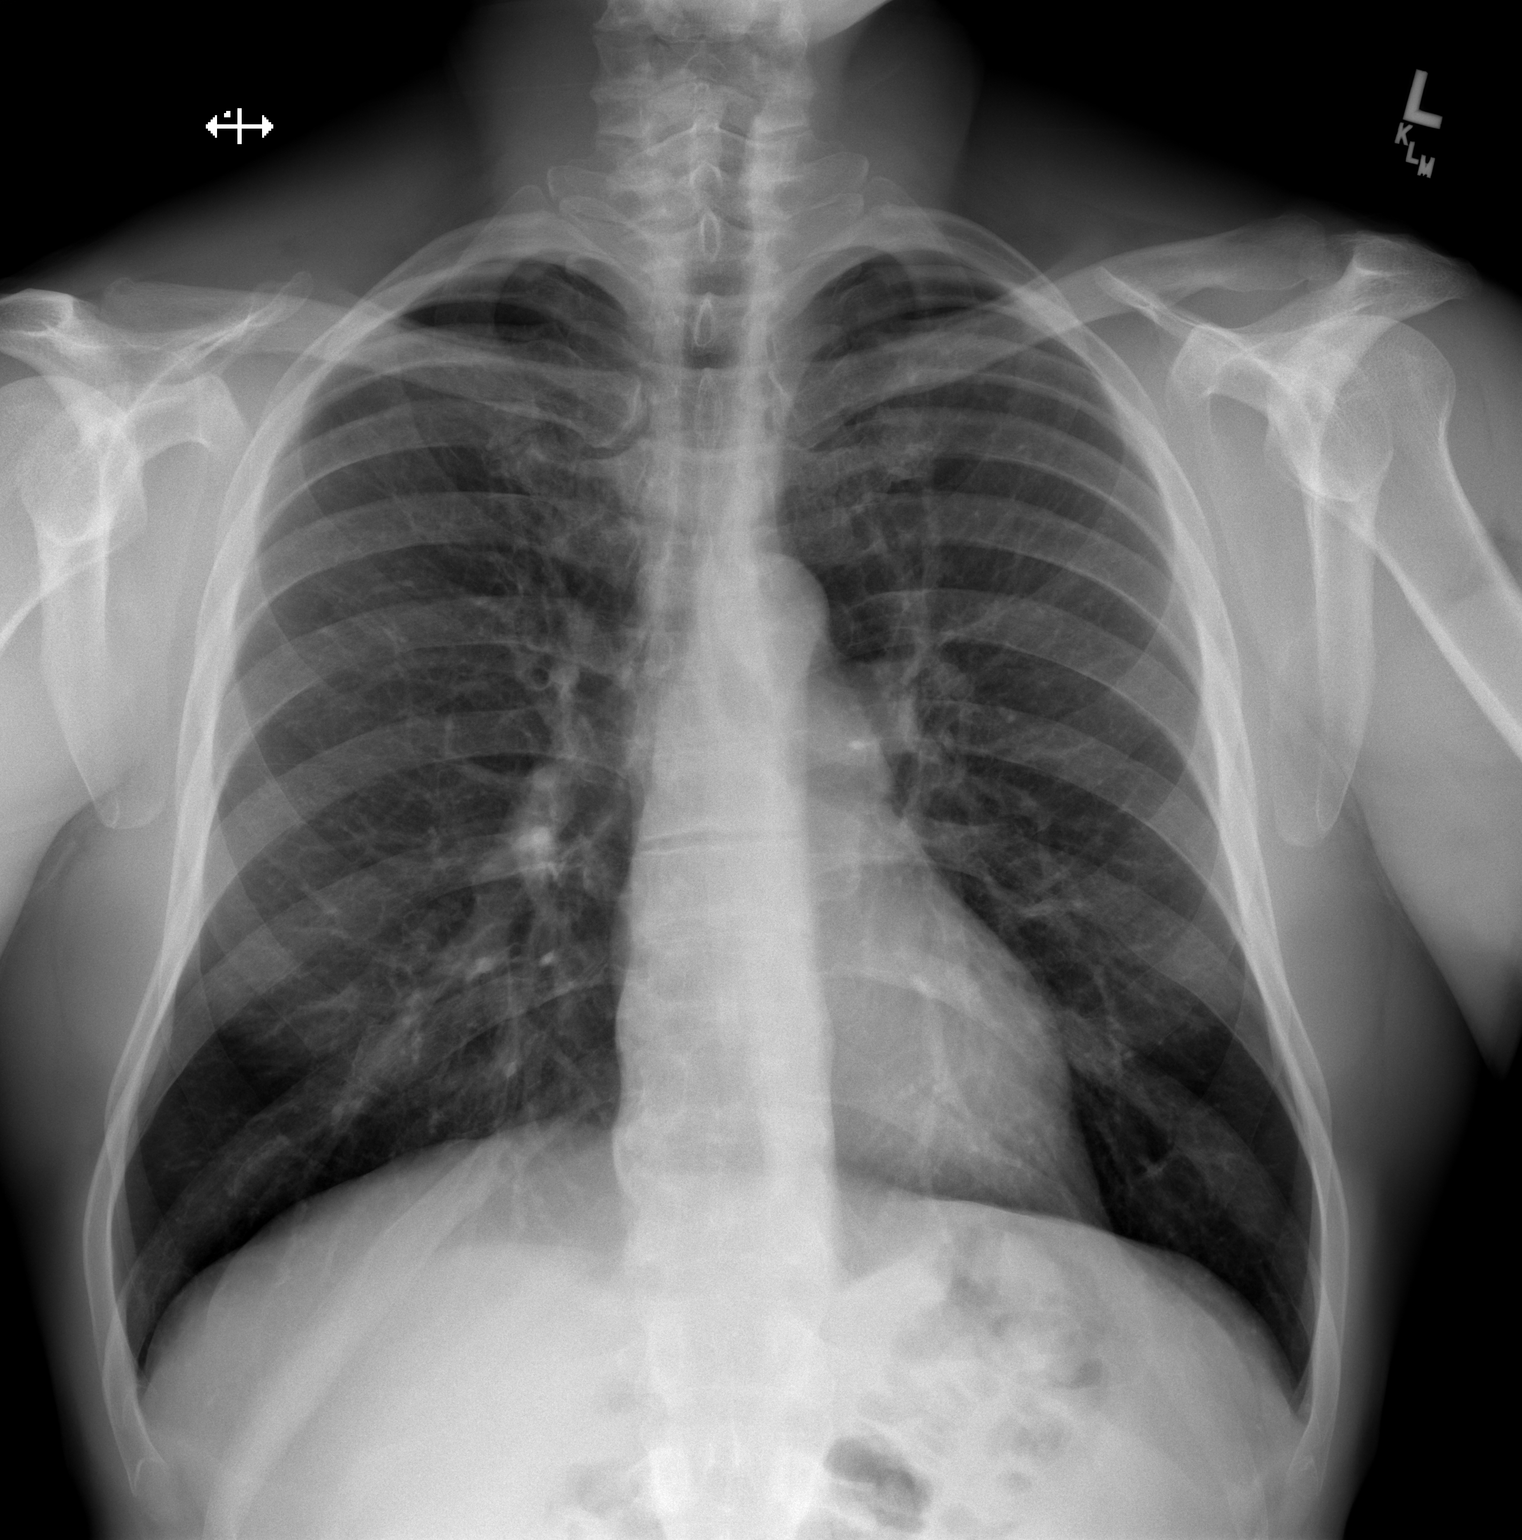

[1 of 1 positions shown; findings below may reference images not displayed]

FINDINGS: Lungs are clear. Heart size and pulmonary vascularity are normal. No
adenopathy. No bone lesions.
IMPRESSION: No abnormality noted.

## 2021-05-21 ENCOUNTER — Encounter: Payer: Self-pay | Admitting: Internal Medicine

## 2022-01-11 ENCOUNTER — Encounter: Payer: Self-pay | Admitting: Internal Medicine

## 2022-01-11 ENCOUNTER — Ambulatory Visit (INDEPENDENT_AMBULATORY_CARE_PROVIDER_SITE_OTHER): Payer: 59 | Admitting: Internal Medicine

## 2022-01-11 VITALS — BP 126/84 | HR 72 | Temp 97.9°F | Resp 16 | Ht 68.0 in | Wt 175.2 lb

## 2022-01-11 DIAGNOSIS — E7800 Pure hypercholesterolemia, unspecified: Secondary | ICD-10-CM

## 2022-01-11 DIAGNOSIS — D229 Melanocytic nevi, unspecified: Secondary | ICD-10-CM | POA: Diagnosis not present

## 2022-01-11 DIAGNOSIS — Z0000 Encounter for general adult medical examination without abnormal findings: Secondary | ICD-10-CM

## 2022-01-11 DIAGNOSIS — E871 Hypo-osmolality and hyponatremia: Secondary | ICD-10-CM | POA: Diagnosis not present

## 2022-01-11 DIAGNOSIS — Z23 Encounter for immunization: Secondary | ICD-10-CM | POA: Diagnosis not present

## 2022-01-11 LAB — COMPREHENSIVE METABOLIC PANEL
ALT: 28 U/L (ref 0–53)
AST: 22 U/L (ref 0–37)
Albumin: 4.6 g/dL (ref 3.5–5.2)
Alkaline Phosphatase: 60 U/L (ref 39–117)
BUN: 12 mg/dL (ref 6–23)
CO2: 27 mEq/L (ref 19–32)
Calcium: 9.5 mg/dL (ref 8.4–10.5)
Chloride: 98 mEq/L (ref 96–112)
Creatinine, Ser: 1.02 mg/dL (ref 0.40–1.50)
GFR: 86.97 mL/min (ref >60.00)
Glucose, Bld: 138 mg/dL — ABNORMAL HIGH (ref 70–99)
Potassium: 3.8 mEq/L (ref 3.5–5.1)
Sodium: 135 mEq/L (ref 135–145)
Total Bilirubin: 1.6 mg/dL — ABNORMAL HIGH (ref 0.2–1.2)
Total Protein: 6.5 g/dL (ref 6.0–8.3)

## 2022-01-11 LAB — CBC WITH DIFFERENTIAL/PLATELET
Basophils Absolute: 0 10*3/uL (ref 0.0–0.1)
Basophils Relative: 0.7 % (ref 0.0–3.0)
Eosinophils Absolute: 0 10*3/uL (ref 0.0–0.7)
Eosinophils Relative: 0.4 % (ref 0.0–5.0)
HCT: 37.3 % — ABNORMAL LOW (ref 39.0–52.0)
Hemoglobin: 13.1 g/dL (ref 13.0–17.0)
Lymphocytes Relative: 23.6 % (ref 12.0–46.0)
Lymphs Abs: 1.6 10*3/uL (ref 0.7–4.0)
MCHC: 35 g/dL (ref 30.0–36.0)
MCV: 84 fl (ref 78.0–100.0)
Monocytes Absolute: 0.4 10*3/uL (ref 0.1–1.0)
Monocytes Relative: 6 % (ref 3.0–12.0)
Neutro Abs: 4.7 10*3/uL (ref 1.4–7.7)
Neutrophils Relative %: 69.3 % (ref 43.0–77.0)
Platelets: 237 10*3/uL (ref 150.0–400.0)
RBC: 4.45 Mil/uL (ref 4.22–5.81)
RDW: 13 % (ref 11.5–15.5)
WBC: 6.8 10*3/uL (ref 4.0–10.5)

## 2022-01-11 LAB — LIPID PANEL
Cholesterol: 249 mg/dL — ABNORMAL HIGH (ref 0–200)
HDL: 63.6 mg/dL (ref >39.00)
NonHDL: 185.57
Total CHOL/HDL Ratio: 4
Triglycerides: 212 mg/dL — ABNORMAL HIGH (ref 0.0–149.0)
VLDL: 42.4 mg/dL — ABNORMAL HIGH (ref 0.0–40.0)

## 2022-01-11 LAB — LDL CHOLESTEROL, DIRECT: Direct LDL: 153 mg/dL

## 2022-01-11 MED ORDER — CICLOPIROX 8 % EX SOLN: Freq: Every day | CUTANEOUS | 12 refills | Status: DC

## 2022-01-11 NOTE — Progress Notes (Unsigned)
   Subjective:    Patient ID: William Waters, male    DOB: 08-Dec-1975, 46 y.o.   MRN: [id number]  DOS:  01/11/2022 Type of visit - description: cpx Since the last office visit, she is doing okay. No major concerns.    Review of Systems See above   Past Medical History:  Diagnosis Date   GERD (gastroesophageal reflux disease)    diet controlled    Hyperlipidemia    Melanocytic nevus of trunk 2016   Compound Lentiginous type, on left mid medial chest    Past Surgical History:  Procedure Laterality Date   LASIK     SKIN LESION EXCISION     cyst, R hip   TONSILLECTOMY AND ADENOIDECTOMY  1980s   VASECTOMY  2016   Dr Amalia Hailey    Current Outpatient Medications  Medication Instructions   ciclopirox (PENLAC) 8 % solution Topical, Daily at bedtime, Apply over nail and surrounding skin. Apply daily over previous coat. After seven (7) days, may remove with alcohol and continue cycle.       Objective:   Physical Exam BP 126/84   Pulse 72   Temp 97.9 F (36.6 C) (Oral)   Resp 16   Ht '5\' 8"'$  (1.727 m)   Wt 175 lb 4 oz (79.5 kg)   SpO2 97%   BMI 26.65 kg/m  General: Well developed, NAD, BMI noted Neck: No  thyromegaly  HEENT:  Normocephalic . Face symmetric, atraumatic Lungs:  CTA B Normal respiratory effort, no intercostal retractions, no accessory muscle use. Heart: RRR,  no murmur.  Abdomen:  Not distended, soft, non-tender. No rebound or rigidity.   Lower extremities: no pretibial edema bilaterally  Skin: Exposed areas without rash. Not pale. Not jaundice Neurologic:  alert & oriented X3.  Speech normal, gait appropriate for age and unassisted Strength symmetric and appropriate for age.  Psych: Cognition and judgment appear intact.  Cooperative with normal attention span and concentration.  Behavior appropriate. No anxious or depressed appearing.     Assessment    Assessment Hyperlipidemia GERD Anxiety  Compound Lentiginous type, on left mid medial  chest  PLAN: Here for CPX, doing well  Hyperlipidemia: Diet controlled, check labs Lentiginosis nevus: Recommend self surveillance, she would like to see somebody regularly, no urgent referral to dermatology Hyponatremia: Per chart review, check serum osmolality and urinary sodium. Dystrophic nail: Great toenails have a minute amount of dystrophy, uses topical onychomycosis treatment temporarily, would like to try again.  Sampling the area for KOH?  They affected tarea is so small,  taking a sample may cause more problems than anything else. RTC 1 year     - Td 2023 - covid vax booster declines  - flu shot - declines  - CCS: had cscope 2017 d/t rectal bleed, wnl, next Cscope 10 years -Continue with his healthy lifestyle -Lab: CMP, FLP, CBC,Serum osmolality, urinary sodium,

## 2022-01-11 NOTE — Patient Instructions (Addendum)
Use the nail treatment every night, once a week clean the nail forcefully with an alcohol swab.  We are referring you to a dermatologist   [facility] : Get the blood work     [place], [place] back for   a physical exam in 1 year

## 2022-01-12 ENCOUNTER — Encounter: Payer: Self-pay | Admitting: Internal Medicine

## 2022-01-12 LAB — SODIUM, URINE, RANDOM: Sodium, Ur: 38 mmol/L (ref 28–272)

## 2022-01-12 NOTE — Assessment & Plan Note (Signed)
Here for CPX, doing well  Hyperlipidemia: Diet controlled, check labs Lentiginosis nevus: Recommend self surveillance, he would like to see somebody regularly, no urgent referral to dermatology placed. Hyponatremia: Per chart review, check serum osmolality and urinary sodium. Dystrophic nail: Great toenails have a minute amount of dystrophy, used topical onychomycosis treatment temporarily, would like to try again.  Sampling the area for KOH?  They affected tarea is so small,  taking a sample may cause more problems than anything else. RTC 1 year

## 2022-01-12 NOTE — Assessment & Plan Note (Signed)
-   Td 2023 - covid vax booster declines  - flu shot - declines  - CCS: had cscope 2017 d/t rectal bleed, wnl, next Cscope 10 years -Continue with his healthy lifestyle -Lab: CMP, FLP, CBC,Serum osmolality, urinary sodium,

## 2022-01-13 LAB — OSMOLALITY: Osmolality: 279 mOsm/kg (ref 278–305)

## 2022-01-14 ENCOUNTER — Other Ambulatory Visit (INDEPENDENT_AMBULATORY_CARE_PROVIDER_SITE_OTHER): Payer: 59

## 2022-01-14 DIAGNOSIS — R739 Hyperglycemia, unspecified: Secondary | ICD-10-CM

## 2022-01-14 LAB — HEMOGLOBIN A1C: Hgb A1c MFr Bld: 5.7 % (ref 4.6–6.5)

## 2022-01-15 ENCOUNTER — Telehealth: Payer: Self-pay | Admitting: Cardiovascular Disease

## 2022-01-15 DIAGNOSIS — E782 Mixed hyperlipidemia: Secondary | ICD-10-CM

## 2022-01-15 NOTE — Telephone Encounter (Signed)
-----   Message from Sherren Mocha, MD sent at 01/15/2022  3:28 PM EDT ----- Nilda Riggs is a friend of mine with hyperlipidemia. I reviewed his labs today and talked to him. Can you order a CT Calcium Score on him?  thx

## 2022-01-15 NOTE — Telephone Encounter (Signed)
Order placed and called to let pt know he will receive a call to schedule. No further questions.

## 2022-01-25 ENCOUNTER — Ambulatory Visit (INDEPENDENT_AMBULATORY_CARE_PROVIDER_SITE_OTHER): Payer: Self-pay

## 2022-01-25 DIAGNOSIS — E782 Mixed hyperlipidemia: Secondary | ICD-10-CM

## 2022-01-29 ENCOUNTER — Telehealth: Payer: Self-pay | Admitting: Cardiovascular Disease

## 2022-01-29 DIAGNOSIS — Z79899 Other long term (current) drug therapy: Secondary | ICD-10-CM

## 2022-01-29 MED ORDER — ROSUVASTATIN CALCIUM 10 MG PO TABS: 10.0000 mg | ORAL_TABLET | Freq: Every day | ORAL | 3 refills | Status: DC

## 2022-01-29 NOTE — Telephone Encounter (Signed)
Called Rosuvastatin '10mg'$  to [facility] and scheduled repeat labs for 05/03/22.

## 2022-01-29 NOTE — Telephone Encounter (Signed)
-----   Message from Sherren Mocha, MD sent at 01/27/2022  4:54 PM EST ----- Findings reviewed with patient.  Please call and rosuvastatin 10 mg daily to [facility].  Lipids and LFTs in 3 months.  Thank you

## 2022-03-22 ENCOUNTER — Telehealth: Payer: Self-pay | Admitting: Internal Medicine

## 2022-03-22 DIAGNOSIS — D229 Melanocytic nevi, unspecified: Secondary | ICD-10-CM

## 2022-03-22 NOTE — Telephone Encounter (Signed)
Pt stated he wanted to resend his referral for dermatology.

## 2022-03-22 NOTE — Telephone Encounter (Signed)
Referral placed.

## 2022-05-03 ENCOUNTER — Other Ambulatory Visit: Payer: 59

## 2022-07-12 ENCOUNTER — Encounter: Payer: Self-pay | Admitting: Family Medicine

## 2022-07-12 ENCOUNTER — Ambulatory Visit: Payer: 59 | Admitting: Family Medicine

## 2022-07-12 VITALS — BP 110/80 | HR 63 | Temp 97.8°F | Resp 18 | Ht 68.0 in | Wt 174.2 lb

## 2022-07-12 DIAGNOSIS — E7800 Pure hypercholesterolemia, unspecified: Secondary | ICD-10-CM

## 2022-07-12 DIAGNOSIS — R5383 Other fatigue: Secondary | ICD-10-CM

## 2022-07-12 DIAGNOSIS — T148XXA Other injury of unspecified body region, initial encounter: Secondary | ICD-10-CM

## 2022-07-12 DIAGNOSIS — S5012XA Contusion of left forearm, initial encounter: Secondary | ICD-10-CM

## 2022-07-12 LAB — LDL CHOLESTEROL, DIRECT: Direct LDL: 136 mg/dL

## 2022-07-12 LAB — CBC WITH DIFFERENTIAL/PLATELET
Basophils Absolute: 0 10*3/uL (ref 0.0–0.1)
Basophils Relative: 0.7 % (ref 0.0–3.0)
Eosinophils Absolute: 0.1 10*3/uL (ref 0.0–0.7)
Eosinophils Relative: 1 % (ref 0.0–5.0)
HCT: 39.3 % (ref 39.0–52.0)
Hemoglobin: 13.7 g/dL (ref 13.0–17.0)
Lymphocytes Relative: 30.3 % (ref 12.0–46.0)
Lymphs Abs: 1.9 10*3/uL (ref 0.7–4.0)
MCHC: 34.8 g/dL (ref 30.0–36.0)
MCV: 84.1 fl (ref 78.0–100.0)
Monocytes Absolute: 0.4 10*3/uL (ref 0.1–1.0)
Monocytes Relative: 6.9 % (ref 3.0–12.0)
Neutro Abs: 3.8 10*3/uL (ref 1.4–7.7)
Neutrophils Relative %: 61.1 % (ref 43.0–77.0)
Platelets: 270 10*3/uL (ref 150.0–400.0)
RBC: 4.68 Mil/uL (ref 4.22–5.81)
RDW: 13.1 % (ref 11.5–15.5)
WBC: 6.2 10*3/uL (ref 4.0–10.5)

## 2022-07-12 LAB — LIPID PANEL
Cholesterol: 230 mg/dL — ABNORMAL HIGH (ref 0–200)
HDL: 60.5 mg/dL (ref >39.00)
NonHDL: 169.15
Total CHOL/HDL Ratio: 4
Triglycerides: 286 mg/dL — ABNORMAL HIGH (ref 0.0–149.0)
VLDL: 57.2 mg/dL — ABNORMAL HIGH (ref 0.0–40.0)

## 2022-07-12 LAB — COMPREHENSIVE METABOLIC PANEL
ALT: 34 U/L (ref 0–53)
AST: 27 U/L (ref 0–37)
Albumin: 4.7 g/dL (ref 3.5–5.2)
Alkaline Phosphatase: 81 U/L (ref 39–117)
BUN: 14 mg/dL (ref 6–23)
CO2: 29 mEq/L (ref 19–32)
Calcium: 9.5 mg/dL (ref 8.4–10.5)
Chloride: 97 mEq/L (ref 96–112)
Creatinine, Ser: 1.09 mg/dL (ref 0.40–1.50)
GFR: 80.03 mL/min (ref >60.00)
Glucose, Bld: 92 mg/dL (ref 70–99)
Potassium: 3.9 mEq/L (ref 3.5–5.1)
Sodium: 136 mEq/L (ref 135–145)
Total Bilirubin: 1.4 mg/dL — ABNORMAL HIGH (ref 0.2–1.2)
Total Protein: 7 g/dL (ref 6.0–8.3)

## 2022-07-12 LAB — APTT: aPTT: 29.4 s (ref 25.4–36.8)

## 2022-07-12 LAB — VITAMIN D 25 HYDROXY (VIT D DEFICIENCY, FRACTURES): VITD: 29.87 ng/mL — ABNORMAL LOW (ref 30.00–100.00)

## 2022-07-12 LAB — VITAMIN B12: Vitamin B-12: 333 pg/mL (ref 211–911)

## 2022-07-12 LAB — TSH: TSH: 1.13 u[IU]/mL (ref 0.35–5.50)

## 2022-07-12 LAB — CK: Total CK: 216 U/L (ref 7–232)

## 2022-07-12 LAB — PROTIME-INR
INR: 1 ratio (ref 0.8–1.0)
Prothrombin Time: 11.1 s (ref 9.6–13.1)

## 2022-07-12 NOTE — Progress Notes (Signed)
Subjective:   By signing my name below, I, Isabelle Course, attest that this documentation has been prepared under the direction and in the presence of Donato Schultz, DO 07/12/22   Patient ID: William Waters, male    DOB: 1975/11/24, 47 y.o.   MRN: [id number]  Chief Complaint  Patient presents with   Bleeding/Bruising    Pt states he has a bruise on his left forearm. 1-2 months, some tenderness. No injuries or falls.     HPI Patient is in today for an office visit.   He complains of a bruise on his left forearm for about 1-2 months. He has no knowledge of any similar bruises. Denies any pain, itchiness, or bleeding.   He endorses new fatigue and is unsure of the cause. He has not been taking his rosuvastatin regularly.   He also endorses left knee swelling. He has a history of a previously meniscus tear.   Past Medical History:  Diagnosis Date   GERD (gastroesophageal reflux disease)    diet controlled    Hyperlipidemia    Melanocytic nevus of trunk 2016   Compound Lentiginous type, on left mid medial chest    Past Surgical History:  Procedure Laterality Date   LASIK     SKIN LESION EXCISION     cyst, R hip   TONSILLECTOMY AND ADENOIDECTOMY  1980s   VASECTOMY  2016   Dr Logan Bores    Family History  Problem Relation Age of Onset   Hyperlipidemia Mother    Hyperlipidemia Father    Hypertension Father    Colitis Father    CAD Father        CABG 6   Breast cancer Maternal Grandfather        GF   CAD Maternal Grandfather    Colon cancer Neg Hx    Prostate cancer Neg Hx     Social History   Socioeconomic History   Marital status: Married    Spouse name: Not on file   Number of children: 3   Years of education: Not on file   Highest education level: Not on file  Occupational History   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   Occupation: owns a Art therapist, Production designer, 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  Tobacco Use   Smoking status: Never   Smokeless tobacco:  Never  Substance and Sexual Activity   Alcohol use: Yes    Alcohol/week: 4.0 standard drinks of alcohol    Types: 4 Cans of beer per week    Comment: socially    Drug use: No   Sexual activity: Not on file  Other Topics Concern   Not on file  Social History Narrative   Household:  wife and 3 children last born 02-2014   Social Determinants of Health   Financial Resource Strain: Not on file  Food Insecurity: Not on file  Transportation Needs: Not on file  Physical Activity: Not on file  Stress: Not on file  Social Connections: Not on file  Intimate Partner Violence: Not on file    Outpatient Medications Prior to Visit  Medication Sig Dispense Refill   ciclopirox (PENLAC) 8 % solution Apply topically at bedtime. Apply over nail and surrounding skin. Apply daily over previous coat. After seven (7) days, may remove with alcohol and continue cycle. 6.6 mL 12   rosuvastatin (CRESTOR) 10 MG tablet Take 1 tablet (10 mg total) by mouth daily. 90 tablet 3   No  facility-administered medications prior to visit.    Allergies  Allergen Reactions   Penicillins     Has patient had a PCN reaction causing immediate rash, facial/tongue/throat swelling, SOB or lightheadedness with hypotension: N Has patient had a PCN reaction causing severe rash involving mucus membranes or skin necrosis: No Has patient had a PCN reaction that required hospitalization No Has patient had a PCN reaction occurring within the last 10 years: No If all of the above answers are "NO", then may proceed with Cephalosporin use. CHILDHOOD ALLERGY -PATIENT CANNOT CONFIRM ANY OF THE REACTION    Review of Systems  Constitutional:  Positive for malaise/fatigue. Negative for fever.  HENT:  Negative for congestion.   Eyes:  Negative for blurred vision.  Respiratory:  Negative for cough and shortness of breath.   Cardiovascular:  Negative for chest pain, palpitations and leg swelling.       (+) bruise on left forearm   Gastrointestinal:  Negative for vomiting.  Musculoskeletal:  Positive for joint pain (knee pain/swelling). Negative for back pain.  Skin:  Negative for rash.  Neurological:  Negative for loss of consciousness and headaches.       Objective:    Physical Exam Vitals and nursing note reviewed.  Constitutional:      Appearance: He is well-developed.  HENT:     Head: Normocephalic and atraumatic.     Nose: Nose normal.  Eyes:     Pupils: Pupils are equal, round, and reactive to light.  Neck:     Thyroid: No thyromegaly.  Cardiovascular:     Rate and Rhythm: Normal rate and regular rhythm.     Heart sounds: No murmur heard. Pulmonary:     Effort: Pulmonary effort is normal. No respiratory distress.     Breath sounds: Normal breath sounds. No wheezing or rales.  Chest:     Chest wall: No tenderness.  Musculoskeletal:        General: No tenderness.     Cervical back: Normal range of motion and neck supple.  Skin:    General: Skin is warm and dry.  Neurological:     General: No focal deficit present.     Mental Status: He is alert and oriented to person, place, and time.  Psychiatric:        Behavior: Behavior normal.        Thought Content: Thought content normal.        Judgment: Judgment normal.     BP 110/80 (BP Location: Right Arm, Patient Position: Sitting, Cuff Size: Normal)   Pulse 63   Temp 97.8 F (36.6 C) (Oral)   Resp 18   Ht 5\' 8"  (1.727 m)   Wt 174 lb 3.2 oz (79 kg)   SpO2 97%   BMI 26.49 kg/m  Wt Readings from Last 3 Encounters:  07/12/22 174 lb 3.2 oz (79 kg)  01/11/22 175 lb 4 oz (79.5 kg)  09/02/20 172 lb 4 oz (78.1 kg)       Assessment & Plan:  Bruising Assessment & Plan: Discoloration L forearm --- blanches with pressure   Orders: -     CBC with Differential/Platelet -     TSH -     Vitamin B12 -     VITAMIN D 25 Hydroxy (Vit-D Deficiency, Fractures) -     Comprehensive metabolic panel -     APTT -     Protime-INR  Other  fatigue Assessment & Plan: Check labs   Orders: -  CBC with Differential/Platelet -     TSH -     Vitamin B12 -     VITAMIN D 25 Hydroxy (Vit-D Deficiency, Fractures) -     Comprehensive metabolic panel -     CK  High cholesterol -     Lipid panel     I,Rachel Rivera,acting as a scribe for Donato Schultz, DO.,have documented all relevant documentation on the behalf of Donato Schultz, DO,as directed by  Donato Schultz, DO while in the presence of Donato Schultz, DO.   I, Donato Schultz, DO, personally preformed the services described in this documentation.  All medical record entries made by the scribe were at my direction and in my presence.  I have reviewed the chart and discharge instructions (if applicable) and agree that the record reflects my personal performance and is accurate and complete. 07/12/22   Donato Schultz, DO

## 2022-07-12 NOTE — Assessment & Plan Note (Signed)
Discoloration L forearm --- blanches with pressure

## 2022-07-12 NOTE — Assessment & Plan Note (Signed)
Check labs 

## 2022-07-23 ENCOUNTER — Other Ambulatory Visit: Payer: Self-pay

## 2022-07-23 DIAGNOSIS — E559 Vitamin D deficiency, unspecified: Secondary | ICD-10-CM

## 2022-07-23 MED ORDER — VITAMIN D (ERGOCALCIFEROL) 1.25 MG (50000 UNIT) PO CAPS: 50000.0000 [IU] | ORAL_CAPSULE | ORAL | 0 refills | Status: DC

## 2022-08-23 ENCOUNTER — Ambulatory Visit
Admission: RE | Admit: 2022-08-23 | Discharge: 2022-08-23 | Disposition: A | Discharge status: Home / Self Care | Payer: No Typology Code available for payment source | Source: Ambulatory Visit | Attending: Nurse Practitioner | Admitting: Nurse Practitioner

## 2022-08-23 ENCOUNTER — Other Ambulatory Visit: Payer: Self-pay | Admitting: General Practice

## 2022-08-23 DIAGNOSIS — S8991XA Unspecified injury of right lower leg, initial encounter: Secondary | ICD-10-CM

## 2022-10-13 ENCOUNTER — Encounter (INDEPENDENT_AMBULATORY_CARE_PROVIDER_SITE_OTHER): Payer: Self-pay

## 2022-10-29 ENCOUNTER — Encounter: Payer: Self-pay | Admitting: Internal Medicine

## 2022-10-29 ENCOUNTER — Ambulatory Visit: Payer: 59 | Admitting: Internal Medicine

## 2022-11-12 ENCOUNTER — Ambulatory Visit: Payer: 59 | Admitting: Internal Medicine

## 2022-11-12 ENCOUNTER — Encounter: Payer: Self-pay | Admitting: Internal Medicine

## 2022-11-12 VITALS — BP 108/64 | HR 70 | Temp 98.1°F | Resp 16 | Ht 68.0 in | Wt 176.2 lb

## 2022-11-12 DIAGNOSIS — H01139 Eczematous dermatitis of unspecified eye, unspecified eyelid: Secondary | ICD-10-CM | POA: Diagnosis not present

## 2022-11-12 DIAGNOSIS — G4709 Other insomnia: Secondary | ICD-10-CM

## 2022-11-12 DIAGNOSIS — E7800 Pure hypercholesterolemia, unspecified: Secondary | ICD-10-CM

## 2022-11-12 DIAGNOSIS — E559 Vitamin D deficiency, unspecified: Secondary | ICD-10-CM

## 2022-11-12 MED ORDER — ZOLPIDEM TARTRATE 10 MG PO TABS: 5.0000 mg | ORAL_TABLET | Freq: Every evening | ORAL | 1 refills | Status: DC | PRN

## 2022-11-12 MED ORDER — PREDNISOLONE ACETATE 1 % OP SUSP: OPHTHALMIC | 0 refills | Status: DC

## 2022-11-12 NOTE — Progress Notes (Unsigned)
Subjective:    Patient ID: William Waters, male    DOB: 11-25-1975, 47 y.o.   MRN: [id number]  DOS:  11/12/2022 Type of visit - description: Acute, rash. In 4-week history of itchy rash around the eyes. No eye problems present  Ambien?  Has changing shifts at work, working nights, has a very hard time working during the daytime.  Started cholesterol medication, chart is reviewed.   Review of Systems See above   Past Medical History:  Diagnosis Date   GERD (gastroesophageal reflux disease)    diet controlled    Hyperlipidemia    Melanocytic nevus of trunk 2016   Compound Lentiginous type, on left mid medial chest    Past Surgical History:  Procedure Laterality Date   LASIK     SKIN LESION EXCISION     cyst, R hip   TONSILLECTOMY AND ADENOIDECTOMY  1980s   VASECTOMY  2016   Dr Logan Bores    Current Outpatient Medications  Medication Instructions   ciclopirox (PENLAC) 8 % solution Topical, Daily at bedtime, Apply over nail and surrounding skin. Apply daily over previous coat. After seven (7) days, may remove with alcohol and continue cycle.   meloxicam (MOBIC) 15 MG tablet 1 tablet, Oral, Daily   Multiple Vitamins-Minerals (ZINC PO) Oral   rosuvastatin (CRESTOR) 10 mg, Oral, Daily   Vitamin D (Ergocalciferol) (DRISDOL) 50,000 Units, Oral, Every 7 days       Objective:   Physical Exam BP 108/64   Pulse 70   Temp 98.1 F (36.7 C) (Oral)   Resp 16   Ht 5\' 8"  (1.727 m)   Wt 176 lb 4 oz (79.9 kg)   SpO2 97%   BMI 26.80 kg/m  General:   Well developed, NAD, BMI noted. HEENT:  Normocephalic . Face symmetric, atraumatic   Skin: Has few 4 to 5 mm macular, erythematous, slightly scaly places around the eyes, more so on the left. Neurologic:  alert & oriented X3.  Speech normal, gait appropriate for age and unassisted Psych--  Cognition and judgment appear intact.  Cooperative with normal attention span and concentration.  Behavior appropriate. No anxious or  depressed appearing.      Assessment     Assessment Hyperlipidemia GERD Anxiety  High cholesterol (cardiology order calcium coronary score, 01/2022, score 74, started rosuvastatin) Vitamin D deficiency Compound Lentiginous type, on left mid medial chest  PLAN: Dermatitis: Suspect atopic dermatitis, recommend topical steroids for few days.  Call if not better. Dyslipidemia: Patient reach out to cardiology, a calcium coronary score November 2023: 74.4, started rosuvastatin 10 mg daily.  Follow-up FLP ordered by Dr. Laury Axon did not show much improvement but patient was not taking medications daily.  He is taking it now.  Check labs.  Low vitamin D, DX April 2024.  On supplements.  Checking labs. 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  Tried Ambien before, PDMP reviewed and okay, thus prescription sent, to take either 5 mg or 10 mg.  Watch for excessive somnolence.    Vaccine advice provided (states he will not take flu or COVID vaccines) RTC labs next week RTC CPX already scheduled for November   Here for CPX, doing well  Hyperlipidemia: Diet controlled, check labs Lentiginosis nevus: Recommend self surveillance, he would like to see somebody regularly, no urgent referral to dermatology placed. Hyponatremia: Per chart review,  check serum osmolality and urinary sodium. Dystrophic nail: Great toenails have a minute amount of dystrophy, used topical onychomycosis treatment temporarily, would like to try again.  Sampling the area for KOH?  They affected tarea is so small,  taking a sample may cause more problems than anything else. RTC 1 year

## 2022-11-12 NOTE — Patient Instructions (Addendum)
Come back next week for fasting blood work  Apply the ophthalmic solution to the skin around your eyes 3 times a day for 1 week, then stop.   Consider flu shot this fall. Consider the new COVID booster to be released in few weeks.  See you in November for your physical exam.

## 2022-11-13 NOTE — Assessment & Plan Note (Signed)
Dermatitis: Suspect atopic dermatitis, recommend topical steroids for few days, rx sent. Call if not better. Dyslipidemia: Patient reach out to cardiology, a calcium coronary score November 2023: 74.4, started rosuvastatin 10 mg daily.  Follow-up FLP ordered by Dr. Laury Axon did not show much improvement but patient was not taking medications daily.  He is taking it now.  Check labs. Low vitamin D, DX April 2024.  On supplements.  Checking labs. 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 Tried Ambien before, PDMP reviewed and okay, thus prescription sent, to take either 5 mg or 10 mg.  Watch for excessive somnolence.    Vaccine advice provided (states he will not take flu or COVID vaccines) RTC labs next week RTC CPX already scheduled for November

## 2022-11-18 ENCOUNTER — Other Ambulatory Visit (INDEPENDENT_AMBULATORY_CARE_PROVIDER_SITE_OTHER): Payer: 59

## 2022-11-18 ENCOUNTER — Encounter: Payer: Self-pay | Admitting: Family Medicine

## 2022-11-18 ENCOUNTER — Ambulatory Visit: Payer: 59 | Admitting: Family Medicine

## 2022-11-18 VITALS — BP 121/76 | HR 55 | Ht 68.0 in | Wt 175.0 lb

## 2022-11-18 DIAGNOSIS — E7800 Pure hypercholesterolemia, unspecified: Secondary | ICD-10-CM

## 2022-11-18 DIAGNOSIS — E559 Vitamin D deficiency, unspecified: Secondary | ICD-10-CM | POA: Diagnosis not present

## 2022-11-18 DIAGNOSIS — H01139 Eczematous dermatitis of unspecified eye, unspecified eyelid: Secondary | ICD-10-CM

## 2022-11-18 LAB — LIPID PANEL
Cholesterol: 208 mg/dL — ABNORMAL HIGH (ref 0–200)
HDL: 68 mg/dL (ref >39.00)
LDL Cholesterol: 116 mg/dL — ABNORMAL HIGH (ref 0–99)
NonHDL: 139.66
Total CHOL/HDL Ratio: 3
Triglycerides: 118 mg/dL (ref 0.0–149.0)
VLDL: 23.6 mg/dL (ref 0.0–40.0)

## 2022-11-18 LAB — AST: AST: 22 U/L (ref 0–37)

## 2022-11-18 LAB — ALT: ALT: 35 U/L (ref 0–53)

## 2022-11-18 LAB — VITAMIN D 25 HYDROXY (VIT D DEFICIENCY, FRACTURES): VITD: 77.34 ng/mL (ref 30.00–100.00)

## 2022-11-18 MED ORDER — METHYLPREDNISOLONE 4 MG PO TABS
ORAL_TABLET | ORAL | 0 refills | Status: DC
Start: 2022-11-18 — End: 2022-12-02

## 2022-11-18 NOTE — Progress Notes (Signed)
Acute Office Visit  Subjective:     Patient ID: William Waters, male    DOB: [birth year], 47 y.o.   MRN: [id number]  Chief Complaint  Patient presents with   Eye Problem    HPI Patient is in today for eye problem.   Discussed the use of AI scribe software for clinical note transcription with the patient, who gave verbal consent to proceed.  History of Present Illness   The patient, with recent atopic dermatitis-like symptoms around the eyes, presents with a worsening of the condition over the past five to six days. Despite the application of a prescribed steroid cream three times daily by PCP on 8/30, the patient reports no improvement and describes the condition as "getting worse." The symptoms are primarily characterized by itching, with occasional tearing noted two nights prior. The patient denies any pain or drainage from the eyes, and the discomfort is localized to the skin under the eyes.  The patient recalls a similar episode in his 15s, characterized by rash around the eyes, but cannot recall the treatment or trigger at that time. He denies any recent changes in soaps, detergents, or potential allergens, and the condition is not present anywhere else on the body.  The patient uses contact lenses and notes a recent change in the solution, from saline to a clear solution provided by his eye doctor. This change occurred approximately a month ago, around the same time the current symptoms began.  Upon waking in the morning, the patient reports swollen eyes, likening the appearance to having been "punched in the face." Despite the discomfort, he denies any vision changes.          ROS  All review of systems negative except what is listed in the HPI      Objective:    BP 121/76   Pulse (!) 55   Ht 5\' 8"  (1.727 m)   Wt 175 lb (79.4 kg)   SpO2 99%   BMI 26.61 kg/m    Physical Exam Vitals reviewed.  Constitutional:      Appearance: Normal appearance.  HENT:      Head: Normocephalic and atraumatic.  Eyes:     General: Vision grossly intact.        Right eye: No discharge.        Left eye: No discharge.     Extraocular Movements: Extraocular movements intact.     Conjunctiva/sclera: Conjunctivae normal.     Pupils: Pupils are equal, round, and reactive to light.   Skin:    General: Skin is warm and dry.  Neurological:     Mental Status: He is alert.  Psychiatric:        Mood and Affect: Mood normal.        Behavior: Behavior normal.        Thought Content: Thought content normal.        Judgment: Judgment normal.     No results found for any visits on 11/18/22.      Assessment & Plan:   Problem List Items Addressed This Visit   None Visit Diagnoses     Atopic dermatitis of eyelid    -  Primary   Relevant Medications   methylPREDNISolone (MEDROL) 4 MG tablet     Short steroid taper Add daily antihistamine  If steroid cream is irritating, you can take a break from it.  Recommend keeping area clean, pat dry, apply Aquaphor to keep moisturized Change back to saline eye solution  in case your new solution is a trigger If not improving, we can consider Allergy Referral or Dermatology   Meds ordered this encounter  Medications   methylPREDNISolone (MEDROL) 4 MG tablet    Sig: Standard 6 day taper    Dispense:  21 tablet    Refill:  0    Order Specific Question:   Supervising Provider    Answer:   Danise Edge A [[id number]]    Return if symptoms worsen or fail to improve.  Clayborne Dana, NP

## 2022-11-18 NOTE — Patient Instructions (Addendum)
Short steroid taper Add daily antihistamine  If steroid cream is irritating, you can take a break from it.  Recommend keeping area clean, pat dry, apply Aquaphor to keep moisturized Change back to saline eye solution in case your new solution is a trigger If not improving, we can consider Allergy Referral or Dermatology

## 2022-11-19 MED ORDER — ROSUVASTATIN CALCIUM 20 MG PO TABS: 20.0000 mg | ORAL_TABLET | Freq: Every day | ORAL | 0 refills | Status: DC

## 2022-11-19 NOTE — Addendum Note (Signed)
Addended byConrad  D on: 11/19/2022 11:27 AM   Modules accepted: Orders

## 2022-11-26 ENCOUNTER — Telehealth: Payer: Self-pay | Admitting: Internal Medicine

## 2022-11-26 DIAGNOSIS — H01139 Eczematous dermatitis of unspecified eye, unspecified eyelid: Secondary | ICD-10-CM

## 2022-11-26 NOTE — Telephone Encounter (Signed)
Spoke w/ Pt- informed of recommendations. Pt verbalized understanding. Allergy referral placed.

## 2022-11-26 NOTE — Telephone Encounter (Signed)
Please advise 

## 2022-11-26 NOTE — Telephone Encounter (Signed)
Pt called & stated that he was advised to inform Dr. Drue Novel if his prescription for prednisoLONE acetate (PRED FORTE) 1 % ophthalmic suspension did not help treat his symptoms. Pt stated that his rash came back once he finished the medication.   Additionally, he wanted to know If Dr. Drue Novel would suggest that he visits an allergy specialist.   Please call & advise pt.

## 2022-11-26 NOTE — Telephone Encounter (Signed)
Recommend hydrocortisone 1% OTC to use sporadically. Agree with referral to an allergist, Dx atopic dermatitis

## 2022-11-26 NOTE — Addendum Note (Signed)
Addended byConrad [place] D on: 11/26/2022 09:35 AM   Modules accepted: Orders

## 2022-12-02 ENCOUNTER — Ambulatory Visit (INDEPENDENT_AMBULATORY_CARE_PROVIDER_SITE_OTHER): Payer: 59 | Admitting: Allergy

## 2022-12-02 ENCOUNTER — Encounter: Payer: Self-pay | Admitting: Allergy

## 2022-12-02 ENCOUNTER — Other Ambulatory Visit: Payer: Self-pay | Admitting: Family Medicine

## 2022-12-02 VITALS — BP 130/72 | HR 61 | Temp 97.6°F | Resp 18 | Ht 68.11 in | Wt 173.5 lb

## 2022-12-02 DIAGNOSIS — L2389 Allergic contact dermatitis due to other agents: Secondary | ICD-10-CM

## 2022-12-02 DIAGNOSIS — E559 Vitamin D deficiency, unspecified: Secondary | ICD-10-CM

## 2022-12-02 DIAGNOSIS — Z880 Allergy status to penicillin: Secondary | ICD-10-CM | POA: Diagnosis not present

## 2022-12-02 MED ORDER — DESONIDE 0.05 % EX CREA: TOPICAL_CREAM | Freq: Two times a day (BID) | CUTANEOUS | 2 refills | Status: DC | PRN

## 2022-12-02 NOTE — Progress Notes (Signed)
New Patient Note  RE: William Waters MRN: [id number] DOB: 1975-07-20 Date of Office Visit: 12/02/2022  Consult requested by: Wanda Plump, MD Primary care provider: Wanda Plump, MD  Chief Complaint: Rash (Has been having some swelling/rash around the eyes. Goes away once he uses steroids but comes back when he is done with it. )  History of Present Illness: I had the pleasure of seeing William Waters for initial evaluation at the [facility] on 12/02/2022. He is a 47 y.o. male, who is referred here by Wanda Plump, MD for the evaluation of rash.  Discussed the use of AI scribe software for clinical note transcription with the patient, who gave verbal consent to proceed.  Patient presents with a persistent rash around the eyes for the past couple of months. The rash, which is worse in the mornings and improves as the day progresses, has not responded to topical steroids. Only medrol pak worked. The patient reports no specific triggers for the rash and has not made any recent changes to personal care products but he has been using a new cologne. The patient has tried using zinc cream and has been taking Zyrtec for the past four days to help with the itching. The patient has experienced similar rashes two or three times in the past, which were short-lived and resolved with treatment.   Frequency of episodes: daily. Suspected triggers are unknown. Denies any fevers, chills, changes in medications, foods or recent infections.   Assessment and Plan: William Waters is a 47 y.o. male with: Allergic contact dermatitis due to other agents Chronic rash around the eyes for several months. Symptoms improve with oral steroids. No known triggers. No other associated symptoms. Recommend patch testing.  Patches are best placed on Monday with return to office on Wednesday and Friday of same week for readings.  Patches once placed should not get wet.  You do not have to stop any medications for patch  testing but should not be on oral prednisone. You can schedule a patch testing visit when convenient for your schedule.   Use desonide 0.05% cream twice a day as needed for mild rash flares - okay to use on the face, neck, groin area. Do not use more than 1 week at a time. Continue with Zyrtec (cetirizine) 10mg  daily to help with the itching.  See below for proper skin care. Use fragrance free and dye free products. No dryer sheets or fabric softener.   No cologne.  Penicillin allergy Consider penicillin allergy skin testing and in office drug challenge in the future.  Over 90% of people with history of penicillin allergy which occurred over 10 years ago are found to be non-allergic.   Return for Patch testing.  Meds ordered this encounter  Medications   desonide (DESOWEN) 0.05 % cream    Sig: Apply topically 2 (two) times daily as needed (mild rash flare). okay to use on the face, neck, groin area. Do not use more than 1 week at a time.    Dispense:  30 g    Refill:  2   Lab Orders  No laboratory test(s) ordered today   Other allergy screening: Asthma: no Rhino conjunctivitis: no Food allergy: no Medication allergy:  Penicillin - unknown reaction as a child.  Hymenoptera allergy: no Urticaria: no History of recurrent infections suggestive of immunodeficency: no  Diagnostics: None.   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   Patellofemoral pain syndrome 11/14/2013   Annual physical exam 09/13/2011   Skin lesion 09/13/2011   High cholesterol 10/12/2007   GERD 10/12/2007   Past Medical History:  Diagnosis Date   GERD (gastroesophageal reflux disease)    diet controlled    Hyperlipidemia    Melanocytic nevus of trunk 2016   Compound  Lentiginous type, on left mid medial chest   Past Surgical History: Past Surgical History:  Procedure Laterality Date   LASIK     SKIN LESION EXCISION     cyst, R hip   TONSILLECTOMY AND ADENOIDECTOMY  1980s   VASECTOMY  2016   Dr Logan Bores   Medication List:  Current Outpatient Medications  Medication Sig Dispense Refill   Cetirizine HCl (ZYRTEC PO) Take by mouth.     desonide (DESOWEN) 0.05 % cream Apply topically 2 (two) times daily as needed (mild rash flare). okay to use on the face, neck, groin area. Do not use more than 1 week at a time. 30 g 2   meloxicam (MOBIC) 15 MG tablet Take 1 tablet by mouth daily.     Multiple Vitamins-Minerals (ZINC PO) Take by mouth.     rosuvastatin (CRESTOR) 20 MG tablet Take 1 tablet (20 mg total) by mouth daily. 90 tablet 0   Vitamin D, Ergocalciferol, (DRISDOL) 1.25 MG (50000 UNIT) CAPS capsule Take 1 capsule (50,000 Units total) by mouth every 7 (seven) days. 12 capsule 0   zolpidem (AMBIEN) 10 MG tablet Take 0.5-1 tablets (5-10 mg total) by mouth at bedtime as needed for sleep. 30 tablet 1   ciclopirox (PENLAC) 8 % solution Apply topically at bedtime. Apply over nail and surrounding skin. Apply daily over previous coat. After seven (7) days, may remove with alcohol and continue cycle. (Patient not taking: Reported on 11/12/2022) 6.6 mL 12   No current facility-administered medications for this visit.   Allergies: Allergies  Allergen Reactions   Penicillins     Has patient had a PCN reaction causing immediate rash, facial/tongue/throat swelling, SOB or lightheadedness with hypotension: N Has patient had a PCN reaction causing severe rash involving mucus membranes or skin necrosis: No Has patient had a PCN reaction that required hospitalization No Has patient had a PCN reaction occurring within the last 10 years: No If all of the above answers are "NO", then may proceed with Cephalosporin use. 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  Tobacco Use   Smoking status: Never   Smokeless tobacco: Never  Vaping Use   Vaping status: Never Used  Substance and Sexual Activity   Alcohol use: Yes    Alcohol/week: 4.0 standard drinks of alcohol    Types: 4 Cans of beer per week    Comment: socially    Drug use: No   Sexual activity: Not on file  Other Topics Concern   Not on file  Social History Narrative  Household:  wife and 3 children last born 02-2014   Social Determinants of Health   Financial Resource Strain: Not on file  Food Insecurity: Not on file  Transportation Needs: Not on file  Physical Activity: Not on file  Stress: Not on file  Social Connections: Not on file   Lives in a 49 year old house. Smoking: denies Occupation: Engineer, drilling HistorySurveyor, minerals in the house: no Engineer, civil (consulting) in the family room: no Carpet in the bedroom: no Heating: electric and electric Cooling: central Pet: yes 2 birds x 1-2 yrs, 1 dog x few weeks  Family History: Family History  Problem Relation Age of Onset   Hyperlipidemia Mother    Hyperlipidemia Father    Hypertension Father    Colitis Father    CAD Father        CABG 33   Breast cancer Maternal Grandfather        GF   CAD Maternal Grandfather    Colon cancer Neg Hx    Prostate cancer Neg Hx    Allergic rhinitis Neg Hx    Angioedema Neg Hx    Asthma Neg Hx    Eczema Neg Hx    Urticaria Neg Hx    Review of Systems  Constitutional:  Negative for appetite change, chills, fever and unexpected weight change.  HENT:  Negative for congestion and rhinorrhea.   Eyes:  Negative for  itching.  Respiratory:  Negative for cough, chest tightness, shortness of breath and wheezing.   Cardiovascular:  Negative for chest pain.  Gastrointestinal:  Negative for abdominal pain.  Genitourinary:  Negative for difficulty urinating.  Skin:  Positive for rash.  Neurological:  Negative for headaches.    Objective: BP 130/72   Pulse 61   Temp 97.6 F (36.4 C)   Resp 18   Ht 5' 8.11" (1.73 m)   Wt 173 lb 8 oz (78.7 kg)   SpO2 97%   BMI 26.30 kg/m  Body mass index is 26.3 kg/m. Physical Exam Vitals and nursing note reviewed.  Constitutional:      Appearance: Normal appearance. He is well-developed.  HENT:     Head: Normocephalic and atraumatic.     Right Ear: Tympanic membrane and external ear normal.     Left Ear: Tympanic membrane and external ear normal.     Nose: Nose normal.     Mouth/Throat:     Mouth: Mucous membranes are moist.     Pharynx: Oropharynx is clear.  Eyes:     Conjunctiva/sclera: Conjunctivae normal.  Cardiovascular:     Rate and William Waters: Normal rate and regular William Waters.     Heart sounds: Normal heart sounds. No murmur heard.    No friction rub. No gallop.  Pulmonary:     Effort: Pulmonary effort is normal.     Breath sounds: Normal breath sounds. No wheezing, rhonchi or rales.  Musculoskeletal:     Cervical back: Neck supple.  Skin:    General: Skin is warm.     Findings: Rash present.     Comments: Mild erythematous rash periorbitally b/l. Clear conjunctiva.  Neurological:     Mental Status: He is alert and oriented to person, place, and time.  Psychiatric:        Behavior: Behavior normal.    The plan was reviewed with the patient/family, and all questions/concerned were addressed.  It was my pleasure to see William Waters today and participate in his care. Please feel free to contact  me with any questions or concerns.  Sincerely,  Wyline Mood, DO Allergy & Immunology  [facility] office:  [phone] [facility] office: [phone]

## 2022-12-02 NOTE — Patient Instructions (Addendum)
Skin Distribution concerning for contact dermatitis. Recommend patch testing.  Patches are best placed on Monday with return to office on Wednesday and Friday of same week for readings.  Patches once placed should not get wet.  You do not have to stop any medications for patch testing but should not be on oral prednisone. You can schedule a patch testing visit when convenient for your schedule.    Use desonide 0.05% cream twice a day as needed for mild rash flares - okay to use on the face, neck, groin area. Do not use more than 1 week at a time. Continue with Zyrtec (cetirizine) 10mg  daily to help with the itching.   See below for proper skin care. Use fragrance free and dye free products. No dryer sheets or fabric softener.   No cologne.  Penicillin allergy Consider penicillin allergy skin testing and in office drug challenge in the future.  Over 90% of people with history of penicillin allergy which occurred over 10 years ago are found to be non-allergic.   Follow up for patch testing in middle of October. Follow up for office visit in 2 months.  Skin care recommendations  Bath time: Always use lukewarm water. AVOID very hot or cold water. Keep bathing time to 5-10 minutes. Do NOT use bubble bath. Use a mild soap and use just enough to wash the dirty areas. Do NOT scrub skin vigorously.  After bathing, pat dry your skin with a towel. Do NOT rub or scrub the skin.  Moisturizers and prescriptions:  ALWAYS apply moisturizers immediately after bathing (within 3 minutes). This helps to lock-in moisture. Use the moisturizer several times a day over the whole body. Good summer moisturizers include: Aveeno, CeraVe, Cetaphil. Good winter moisturizers include: Aquaphor, Vaseline, Cerave, Cetaphil, Eucerin, Vanicream. When using moisturizers along with medications, the moisturizer should be applied about one hour after applying the medication to prevent diluting effect of the medication or  moisturize around where you applied the medications. When not using medications, the moisturizer can be continued twice daily as maintenance.  Laundry and clothing: Avoid laundry products with added color or perfumes. Use unscented hypo-allergenic laundry products such as Tide free, Cheer free & gentle, and All free and clear.  If the skin still seems dry or sensitive, you can try double-rinsing the clothes. Avoid tight or scratchy clothing such as wool. Do not use fabric softeners or dyer sheets.

## 2022-12-14 ENCOUNTER — Ambulatory Visit: Payer: 59 | Admitting: Allergy

## 2023-01-10 ENCOUNTER — Other Ambulatory Visit: Payer: Self-pay

## 2023-01-10 ENCOUNTER — Ambulatory Visit: Payer: 59 | Admitting: Family Medicine

## 2023-01-10 ENCOUNTER — Encounter: Payer: Self-pay | Admitting: Family Medicine

## 2023-01-10 VITALS — BP 132/80 | HR 67 | Temp 98.3°F | Resp 16

## 2023-01-10 DIAGNOSIS — L235 Allergic contact dermatitis due to other chemical products: Secondary | ICD-10-CM | POA: Insufficient documentation

## 2023-01-10 NOTE — Progress Notes (Signed)
Follow-up Note  RE: William Waters MRN: [id number] DOB: Apr 02, 1975 Date of Office Visit: 01/10/2023  Primary care provider: Wanda Plump, MD Referring provider: Wanda Plump, MD   William Waters returns to the office today for the patch test placement, given suspected history of contact dermatitis.    Diagnostics: True Test patches placed.   Plan:   Allergic contact dermatitis - Instructions provided on care of the patches for the next 48 hours. William Waters was instructed to avoid showering for the next 48 hours. William Waters will follow up in 48 hours and 96 hours for patch readings.    Call the clinic if this treatment plan is not working well for you  Follow up in 2 days or sooner if needed.  Thank you for the opportunity to care for this patient.  Please do not hesitate to contact me with questions.  Thermon Leyland, FNP [facility]

## 2023-01-10 NOTE — Patient Instructions (Signed)
Diagnostics: True Test patches placed.   Plan:   Allergic contact dermatitis - Instructions provided on care of the patches for the next 48 hours. William Waters was instructed to avoid showering for the next 48 hours. William Waters will follow up in 48 hours and 96 hours for patch readings.    Call the clinic if this treatment plan is not working well for you  Follow up in 2 days or sooner if needed.

## 2023-01-12 ENCOUNTER — Ambulatory Visit (INDEPENDENT_AMBULATORY_CARE_PROVIDER_SITE_OTHER): Payer: 59 | Admitting: Family

## 2023-01-12 ENCOUNTER — Encounter: Payer: Self-pay | Admitting: Family

## 2023-01-12 DIAGNOSIS — L235 Allergic contact dermatitis due to other chemical products: Secondary | ICD-10-CM | POA: Diagnosis not present

## 2023-01-12 NOTE — Progress Notes (Signed)
William Waters returns to the office today for the 48 hour patch test interpretation, given suspected history of contact dermatitis. He reports itchy redness around his eyes.   Diagnostics:   TRUE TEST 48-hour hour reading: all negative   Plan:   Allergic contact dermatitis - Return Friday November 1,2024 for final reading  Nehemiah Settle, FNP [facility]

## 2023-01-14 ENCOUNTER — Ambulatory Visit (INDEPENDENT_AMBULATORY_CARE_PROVIDER_SITE_OTHER): Payer: 59 | Admitting: Internal Medicine

## 2023-01-14 ENCOUNTER — Encounter: Payer: Self-pay | Admitting: Internal Medicine

## 2023-01-14 VITALS — BP 126/84 | HR 55 | Temp 97.8°F | Resp 16 | Ht 68.0 in | Wt 175.5 lb

## 2023-01-14 DIAGNOSIS — R739 Hyperglycemia, unspecified: Secondary | ICD-10-CM | POA: Diagnosis not present

## 2023-01-14 DIAGNOSIS — L235 Allergic contact dermatitis due to other chemical products: Secondary | ICD-10-CM | POA: Diagnosis not present

## 2023-01-14 DIAGNOSIS — E559 Vitamin D deficiency, unspecified: Secondary | ICD-10-CM

## 2023-01-14 DIAGNOSIS — Z0000 Encounter for general adult medical examination without abnormal findings: Secondary | ICD-10-CM | POA: Diagnosis not present

## 2023-01-14 DIAGNOSIS — Z8042 Family history of malignant neoplasm of prostate: Secondary | ICD-10-CM

## 2023-01-14 DIAGNOSIS — E7800 Pure hypercholesterolemia, unspecified: Secondary | ICD-10-CM

## 2023-01-14 MED ORDER — DESONIDE 0.05 % EX CREA: TOPICAL_CREAM | CUTANEOUS | 5 refills | Status: DC

## 2023-01-14 MED ORDER — CETIRIZINE HCL 10 MG PO TABS: 10.0000 mg | ORAL_TABLET | Freq: Every day | ORAL | 5 refills | Status: DC

## 2023-01-14 NOTE — Patient Instructions (Addendum)
Contact Dermatitis Eyelid Dermatitis  - Questionable reaction to bacitracin and gold. Discussed avoidance.  - Avoid plug in fragrances and aerosolized sprays. - Use a gentle, unscented cleanser or just water to clean the face. Use Vaseline at least twice daily around the eyelids.  For rest of the face, use a gentle unscented cream (Cerave, Cetaphil, Eucerin, Aveeno). Dry skin makes the itching and rash worse.  - Use only unscented liquid laundry detergent. - Apply prescribed topical steroid (desonide 0.05%) to flared areas (red and thickened) after the moisturizer has soaked into the skin (wait at least 30 minutes). Taper off the topical steroids as the skin improves. Do not use topical steroid for more than 7 days at a time. - Can use Zyrtec (Cetirizine) 10mg  daily to see if it helps with itching.

## 2023-01-14 NOTE — Progress Notes (Signed)
   Follow Up Note  RE: William Waters MRN: [id number] DOB: [birth year] Date of Office Visit: 01/14/2023  Referring provider: Wanda Plump, MD Primary care provider: Wanda Plump, MD  History of Present Illness: I had the pleasure of seeing William Waters for a follow up visit at the [facility] on 01/14/2023. He is a 47 y.o. male, who is being followed for contact dermatitis. Today he is here for final patch test interpretation, given suspected history of contact dermatitis.   Diagnostics:   TRUE TEST 96-hour hour reading:  ? reaction to #28 (Gold sodium thiosulfate) and ? reaction to #33 (Bacitracin)      Assessment and Plan: William Waters is a 47 y.o. male with: Contact Dermatitis Eyelid Dermatitis  - Questionable reaction to bacitracin and gold. Discussed avoidance.  - Avoid plug in fragrances and aerosolized sprays. - Use a gentle, unscented cleanser or just water to clean the face. Use Vaseline at least twice daily around the eyelids.  For rest of the face, use a gentle unscented cream (Cerave, Cetaphil, Eucerin, Aveeno). Dry skin makes the itching and rash worse.  - Use only unscented liquid laundry detergent. - Apply prescribed topical steroid (desonide 0.05%) to flared areas (red and thickened) after the moisturizer has soaked into the skin (wait at least 30 minutes). Taper off the topical steroids as the skin improves. Do not use topical steroid for more than 7 days at a time. - If no improvement, can consider Protopic.  Return in about 3 months (around 04/16/2023).  It was my pleasure to see William Waters today and participate in his care. Please feel free to contact me with any questions or concerns.  Sincerely,   Alesia Morin, MD [facility]

## 2023-01-14 NOTE — Progress Notes (Unsigned)
Subjective:    Patient ID: William Waters, male    DOB: [birth year], 47 y.o.   MRN: [id number]  DOS:  01/14/2023 Type of visit - description: CPX  Here for CPX Doing well. Other than allergies and some  right knee pain he is doing well. Review of Systems See above   Past Medical History:  Diagnosis Date   GERD (gastroesophageal reflux disease)    diet controlled    Hyperlipidemia    Melanocytic nevus of trunk 2016   Compound Lentiginous type, on left mid medial chest    Past Surgical History:  Procedure Laterality Date   LASIK     SKIN LESION EXCISION     cyst, R hip   TONSILLECTOMY AND ADENOIDECTOMY  1980s   VASECTOMY  2016   Dr Logan Bores    Current Outpatient Medications  Medication Instructions   cetirizine (ZYRTEC ALLERGY) 10 mg, Oral, Daily   ciclopirox (PENLAC) 8 % solution Topical, Daily at bedtime, Apply over nail and surrounding skin. Apply daily over previous coat. After seven (7) days, may remove with alcohol and continue cycle.   desonide (DESOWEN) 0.05 % cream Apply twice daily for flare ups.  Maximum use 7 days.   meloxicam (MOBIC) 15 MG tablet 1 tablet, Oral, Daily   Multiple Vitamins-Minerals (ZINC PO) Oral   rosuvastatin (CRESTOR) 20 mg, Oral, Daily   Vitamin D (Ergocalciferol) (DRISDOL) 50,000 Units, Oral, Every 7 days   zolpidem (AMBIEN) 5-10 mg, Oral, At bedtime PRN       Objective:   Physical Exam BP 126/84   Pulse (!) 55   Temp 97.8 F (36.6 C) (Oral)   Resp 16   Ht 5\' 8"  (1.727 m)   Wt 175 lb 8 oz (79.6 kg)   SpO2 97%   BMI 26.68 kg/m  General: Well developed, NAD, BMI noted Neck: No  thyromegaly  HEENT:  Normocephalic . Face symmetric, atraumatic Lungs:  CTA B Normal respiratory effort, no intercostal retractions, no accessory muscle use. Heart: RRR,  no murmur.  Abdomen:  Not distended, soft, non-tender. No rebound or rigidity.   Lower extremities: no pretibial edema bilaterally  Skin: Exposed areas without rash. Not pale. Not  jaundice Neurologic:  alert & oriented X3.  Speech normal, gait appropriate for age and unassisted Strength symmetric and appropriate for age.  Psych: Cognition and judgment appear intact.  Cooperative with normal attention span and concentration.  Behavior appropriate. No anxious or depressed appearing.     Assessment      Assessment Hyperlipidemia GERD Anxiety  High cholesterol (cardiology order calcium coronary score, 01/2022, score 74, started rosuvastatin) Vitamin D deficiency Compound Lentiginous type, on left mid medial chest  PLAN: Here for CPX    Td 2023 - covid vax booster declines  - flu shot - declines  - CCS: had cscope 2017 d/t rectal bleed, wnl, next Cscope 10 years - Prostate cancer screening: His father was recently diagnosed with prostate cancer, he is asymptomatic, declined DRE, check PSA -Has a very healthy lifestyle.-Lab: RTC fasting for  Vitamin D BMP AST ALT FLP A1c PSA  Hyperlipidemia: Based on last FLP, Crestor increased to 20 mg, checking labs, good compliance. Vitamin D deficiency: Had ergocalciferol, currently not on supplements, plan to check levels, OTC vitamin D 2000 units daily. Dermatitis: Around the eyes per the allergist Insomnia: Occasionally takes Ambien. RTC labs next week, RTC CPX 1 year, sooner depending on blood work ==  Dermatitis: Suspect atopic dermatitis, recommend topical steroids  for few days, rx sent. Call if not better. Dyslipidemia: Patient reach out to cardiology, a calcium coronary score November 2023: 74.4, started rosuvastatin 10 mg daily.  Follow-up FLP ordered by Dr. Laury Axon did not show much improvement but patient was not taking medications daily.  He is taking it now.  Check labs. Low vitamin D, DX April 2024.  On supplements.  Checking labs. 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 Tried Ambien before, PDMP reviewed and okay, thus  prescription sent, to take either 5 mg or 10 mg.  Watch for excessive somnolence.    Vaccine advice provided (states he will not take flu or COVID vaccines) RTC labs next week RTC CPX already scheduled for November

## 2023-01-14 NOTE — Patient Instructions (Addendum)
Take vitamin D over-the-counter: 2000 units daily.  From this point, will start checking  your prostate (PSA) at least yearly.    Get labs fasting next week. Next visit with me in 1 year, sooner if needed. Please schedule it at the front desk

## 2023-01-16 ENCOUNTER — Encounter: Payer: Self-pay | Admitting: Internal Medicine

## 2023-01-16 NOTE — Assessment & Plan Note (Signed)
Here for CPX Hyperlipidemia: Based on last FLP, Crestor increased to 20 mg, checking labs, good compliance. Vitamin D deficiency: Had ergocalciferol, currently not on supplements, plan to check levels, OTC vitamin D 2000 units daily. Dermatitis: Around the eyes per the allergist Insomnia: Occasionally takes Ambien. RTC labs next week, RTC CPX 1 year, sooner depending on blood work

## 2023-01-16 NOTE — Assessment & Plan Note (Signed)
Here for CPX  Td 2023 - covid vax booster declines  - flu shot - declines  - CCS: had cscope 2017 d/t rectal bleed, wnl, next Cscope 10 years - Prostate cancer screening: His father was recently diagnosed with prostate cancer, he is asymptomatic, declined DRE, check PSA -Has a very healthy lifestyle. -Lab: RTC fasting for  Vitamin D BMP AST ALT FLP A1c PSA

## 2023-01-21 ENCOUNTER — Other Ambulatory Visit (INDEPENDENT_AMBULATORY_CARE_PROVIDER_SITE_OTHER): Payer: 59

## 2023-01-21 DIAGNOSIS — R739 Hyperglycemia, unspecified: Secondary | ICD-10-CM

## 2023-01-21 DIAGNOSIS — Z8042 Family history of malignant neoplasm of prostate: Secondary | ICD-10-CM

## 2023-01-21 DIAGNOSIS — E559 Vitamin D deficiency, unspecified: Secondary | ICD-10-CM | POA: Diagnosis not present

## 2023-01-21 DIAGNOSIS — E7800 Pure hypercholesterolemia, unspecified: Secondary | ICD-10-CM

## 2023-01-21 LAB — LIPID PANEL
Cholesterol: 194 mg/dL (ref 0–200)
HDL: 61.1 mg/dL (ref >39.00)
LDL Cholesterol: 108 mg/dL — ABNORMAL HIGH (ref 0–99)
NonHDL: 133.34
Total CHOL/HDL Ratio: 3
Triglycerides: 127 mg/dL (ref 0.0–149.0)
VLDL: 25.4 mg/dL (ref 0.0–40.0)

## 2023-01-21 LAB — BASIC METABOLIC PANEL
BUN: 13 mg/dL (ref 6–23)
CO2: 31 meq/L (ref 19–32)
Calcium: 9.2 mg/dL (ref 8.4–10.5)
Chloride: 101 meq/L (ref 96–112)
Creatinine, Ser: 1.14 mg/dL (ref 0.40–1.50)
GFR: 75.56 mL/min (ref >60.00)
Glucose, Bld: 91 mg/dL (ref 70–99)
Potassium: 4.7 meq/L (ref 3.5–5.1)
Sodium: 140 meq/L (ref 135–145)

## 2023-01-21 LAB — AST: AST: 27 U/L (ref 0–37)

## 2023-01-21 LAB — HEMOGLOBIN A1C: Hgb A1c MFr Bld: 5.6 % (ref 4.6–6.5)

## 2023-01-21 LAB — VITAMIN D 25 HYDROXY (VIT D DEFICIENCY, FRACTURES): VITD: 44.54 ng/mL (ref 30.00–100.00)

## 2023-01-21 LAB — PSA: PSA: 0.41 ng/mL (ref 0.10–4.00)

## 2023-01-21 LAB — ALT: ALT: 37 U/L (ref 0–53)

## 2023-01-24 MED ORDER — ROSUVASTATIN CALCIUM 40 MG PO TABS: 40.0000 mg | ORAL_TABLET | Freq: Every day | ORAL | 0 refills | Status: DC

## 2023-01-24 NOTE — Addendum Note (Signed)
Addended byConrad Sunol D on: 01/24/2023 07:55 AM   Modules accepted: Orders

## 2023-01-27 ENCOUNTER — Telehealth: Payer: Self-pay | Admitting: Internal Medicine

## 2023-01-27 NOTE — Telephone Encounter (Signed)
Pdmp ok, rx sent  ? ?

## 2023-01-27 NOTE — Telephone Encounter (Signed)
Requesting: Ambien 10mg   Contract:  01/14/23? UDS: Ambien only Last Visit  01/14/23 Next Visit: 01/25/24 Last Refill: 11/12/22 #30 and 1RF   Please Advise

## 2023-04-08 ENCOUNTER — Encounter: Payer: Self-pay | Admitting: Internal Medicine

## 2023-04-13 ENCOUNTER — Encounter: Payer: Self-pay | Admitting: Physician Assistant

## 2023-04-13 ENCOUNTER — Ambulatory Visit: Payer: Self-pay | Admitting: Internal Medicine

## 2023-04-13 ENCOUNTER — Ambulatory Visit: Payer: 59 | Admitting: Physician Assistant

## 2023-04-13 VITALS — BP 136/80 | HR 84 | Temp 99.6°F | Ht 68.0 in | Wt 178.5 lb

## 2023-04-13 DIAGNOSIS — J101 Influenza due to other identified influenza virus with other respiratory manifestations: Secondary | ICD-10-CM

## 2023-04-13 DIAGNOSIS — R6889 Other general symptoms and signs: Secondary | ICD-10-CM

## 2023-04-13 LAB — POCT INFLUENZA A/B
Influenza A, POC: POSITIVE — AB
Influenza B, POC: NEGATIVE

## 2023-04-13 LAB — POC COVID19 BINAXNOW: SARS Coronavirus 2 Ag: NEGATIVE

## 2023-04-13 NOTE — Progress Notes (Signed)
Established patient visit   Patient: William Waters   DOB: 06-18-1975   48 y.o. Male  MRN: [id number] Visit Date: 04/13/2023  Today's healthcare provider: Alfredia Ferguson, PA-C   Cc. Cough, congestion, flu exposure  Subjective     Pt reports cough, congestion, fatigue, chills, headaches, x 2-3 days. Reports losing his sense of taste. Family has the flu.   Only tylenol otc.  Medications: Outpatient Medications Prior to Visit  Medication Sig   cetirizine (ZYRTEC ALLERGY) 10 MG tablet Take 1 tablet (10 mg total) by mouth daily.   Cholecalciferol (VITAMIN D) 50 MCG (2000 UT) CAPS Take by mouth.   ciclopirox (PENLAC) 8 % solution Apply topically at bedtime. Apply over nail and surrounding skin. Apply daily over previous coat. After seven (7) days, may remove with alcohol and continue cycle.   desonide (DESOWEN) 0.05 % cream Apply twice daily for flare ups.  Maximum use 7 days.   meloxicam (MOBIC) 15 MG tablet Take 1 tablet by mouth daily.   Multiple Vitamins-Minerals (ZINC PO) Take by mouth.   rosuvastatin (CRESTOR) 40 MG tablet Take 1 tablet (40 mg total) by mouth at bedtime.   Vitamin D, Ergocalciferol, (DRISDOL) 1.25 MG (50000 UNIT) CAPS capsule Take 1 capsule (50,000 Units total) by mouth every 7 (seven) days.   zolpidem (AMBIEN) 10 MG tablet Take 0.5-1 tablets (5-10 mg total) by mouth at bedtime as needed for sleep.   No facility-administered medications prior to visit.    Review of Systems  Constitutional:  Positive for chills, fatigue and fever.  HENT:  Positive for congestion, sinus pressure and sore throat.   Respiratory:  Positive for cough. Negative for shortness of breath.   Cardiovascular:  Negative for chest pain, palpitations and leg swelling.  Neurological:  Positive for headaches. Negative for dizziness.       Objective    BP 136/80   Pulse 84   Temp 99.6 F (37.6 C) (Oral)   Ht 5\' 8"  (1.727 m)   Wt 178 lb 8 oz (81 kg)   SpO2 100%   BMI 27.14 kg/m     Physical Exam Constitutional:      General: He is awake.     Appearance: He is well-developed.  HENT:     Head: Normocephalic.  Eyes:     Conjunctiva/sclera: Conjunctivae normal.  Cardiovascular:     Rate and Rhythm: Normal rate and regular rhythm.     Heart sounds: Normal heart sounds.  Pulmonary:     Effort: Pulmonary effort is normal.     Breath sounds: Normal breath sounds.  Skin:    General: Skin is warm.  Neurological:     Mental Status: He is alert and oriented to person, place, and time.  Psychiatric:        Attention and Perception: Attention normal.        Mood and Affect: Mood normal.        Speech: Speech normal.        Behavior: Behavior is cooperative.     Results for orders placed or performed in visit on 04/13/23  POCT Influenza A/B  Result Value Ref Range   Influenza A, POC Positive (A) Negative   Influenza B, POC Negative Negative  POC COVID-19  Result Value Ref Range   SARS Coronavirus 2 Ag Negative Negative    Assessment & Plan    Influenza A  Flu-like symptoms -     POCT Influenza A/B -  POC COVID-19 BinaxNow  Reviewed tamiflu, antiviral tx. Pt feels comfortable forgoing tamiflu, I agree.  Hydrate, rest, antihistamines, nasal sprays, otc cough meds.   Return if symptoms worsen or fail to improve.       Alfredia Ferguson, PA-C  [facility] [phone] (phone) [phone] (fax)  [facility]

## 2023-04-13 NOTE — Telephone Encounter (Signed)
Copied from CRM [id number]. Topic: Clinical - Red Word Triage >> Apr 13, 2023  8:04 AM [name] wrote: Red Word that prompted transfer to Nurse Triage: Patient states he's having, chills, fever, congestion in chest, body hurts all over.   Chief Complaint: Fever, Chills, Body Aches Symptoms: Fever, Chills, Body Aches, Malaise Frequency: Few Days Pertinent Negatives: Patient denies chest pain, shortness of breath, or distress like symptoms.  Disposition: [] ED /[] Urgent Care (no appt availability in office) / [x] Appointment(In office/virtual)/ []  [facility]/ [] Home Care/ [] Refused Recommended Disposition /[] [facility]/ []  Follow-up with PCP Additional Notes: William Waters is a 49 year old male being triaged today for cough, chills, body aches, and a fever. The patient has no contributing medical history. Reports his children are sick with the flu and he has had these symptoms for a couple of days. In office appointment made for today.    Reason for Disposition  [1] Fever returns after gone for over 24 hours AND [2] symptoms worse or not improved  Answer Assessment - Initial Assessment Questions 1. ONSET: "When did the cough begin?"      A few  days ago  2. SEVERITY: "How bad is the cough today?"      Intermittent  3. SPUTUM: "Describe the color of your sputum" (none, dry cough; clear, white, yellow, green)     Unsure, coughs and flushes  4. HEMOPTYSIS: "Are you coughing up any blood?" If so ask: "How much?" (flecks, streaks, tablespoons, etc.)     No  5. DIFFICULTY BREATHING: "Are you having difficulty breathing?" If Yes, ask: "How bad is it?" (e.g., mild, moderate, severe)    - MILD: No SOB at rest, mild SOB with walking, speaks normally in sentences, can lie down, no retractions, pulse < 100.    - MODERATE: SOB at rest, SOB with minimal exertion and prefers to sit, cannot lie down flat, speaks in phrases, mild retractions, audible wheezing, pulse 100-120.    - SEVERE:  Very SOB at rest, speaks in single words, struggling to breathe, sitting hunched forward, retractions, pulse > 120      No  6. FEVER: "Do you have a fever?" If Yes, ask: "What is your temperature, how was it measured, and when did it start?"     Yes, Unsure  7. CARDIAC HISTORY: "Do you have any history of heart disease?" (e.g., heart attack, congestive heart failure)      No  8. LUNG HISTORY: "Do you have any history of lung disease?"  (e.g., pulmonary embolus, asthma, emphysema)     No  9. PE RISK FACTORS: "Do you have a history of blood clots?" (or: recent major surgery, recent prolonged travel, bedridden)     No  10. OTHER SYMPTOMS: "Do you have any other symptoms?" (e.g., runny nose, wheezing, chest pain)       Fever, Chills, Body Aches, Headache  12. TRAVEL: "Have you traveled out of the country in the last month?" (e.g., travel history, exposures)       Children are sick with influenza  Protocols used: Cough - Acute Productive-A-AH

## 2023-04-19 NOTE — Progress Notes (Deleted)
 Follow Up Note  RE: William Waters MRN: [id number] DOB: 1975-11-04 Date of Office Visit: 04/20/2023  Referring provider: [name], MD Primary care provider: [name], MD  Chief Complaint: No chief complaint on file.  History of Present Illness: I had the pleasure of seeing William Waters for a follow up visit at the [facility] on 04/19/2023. He is a 48 y.o. male, who is being followed for dermatitis, penicillin allergy. His previous allergy office visit was on 12/02/2022 with Dr. Luke. Today is a regular follow up visit.  Discussed the use of AI scribe software for clinical note transcription with the patient, who gave verbal consent to proceed.  History of Present Illness            ***  Assessment and Plan: Amun is a 48 y.o. male with:  Contact Dermatitis Eyelid Dermatitis  - Questionable reaction to bacitracin and gold. Discussed avoidance.  - Avoid plug in fragrances and aerosolized sprays. - Use a gentle, unscented cleanser or just water to clean the face. Use Vaseline at least twice daily around the eyelids.  For rest of the face, use a gentle unscented cream (Cerave, Cetaphil, Eucerin, Aveeno). Dry skin makes the itching and rash worse.  - Use only unscented liquid laundry detergent. - Apply prescribed topical steroid (desonide  0.05%) to flared areas (red and thickened) after the moisturizer has soaked into the skin (wait at least 30 minutes). Taper off the topical steroids as the skin improves. Do not use topical steroid for more than 7 days at a time. - If no improvement, can consider Protopic.  Return in about 3 months (around 04/16/2023).  Allergic contact dermatitis due to other agents Chronic rash around the eyes for several months. Symptoms improve with oral steroids. No known triggers. No other associated symptoms. Recommend patch testing.  Patches are best placed on Monday with return to office on Wednesday and Friday of same week for  readings.  Patches once placed should not get wet.  You do not have to stop any medications for patch testing but should not be on oral prednisone . You can schedule a patch testing visit when convenient for your schedule.   Use desonide  0.05% cream twice a day as needed for mild rash flares - okay to use on the face, neck, groin area. Do not use more than 1 week at a time. Continue with Zyrtec  (cetirizine ) 10mg  daily to help with the itching.  See below for proper skin care. Use fragrance free and dye free products. No dryer sheets or fabric softener.   No cologne.   Penicillin allergy Consider penicillin allergy skin testing and in office drug challenge in the future.  Over 90% of people with history of penicillin allergy which occurred over 10 years ago are found to be non-allergic.  Assessment and Plan              No follow-ups on file.  No orders of the defined types were placed in this encounter.  Lab Orders  No laboratory test(s) ordered today    Diagnostics: Spirometry:  Tracings reviewed. His effort: {Blank single:19197::Good reproducible efforts.,It was hard to get consistent efforts and there is a question as to whether this reflects a maximal maneuver.,Poor effort, data can not be interpreted.} FVC: ***L FEV1: ***L, ***% predicted FEV1/FVC ratio: ***% Interpretation: {Blank single:19197::Spirometry consistent with mild obstructive disease,Spirometry consistent with moderate obstructive disease,Spirometry consistent with severe obstructive disease,Spirometry consistent with possible restrictive  disease,Spirometry consistent with mixed obstructive and restrictive disease,Spirometry uninterpretable due to technique,Spirometry consistent with normal pattern,No overt abnormalities noted given today's efforts}.  Please see scanned spirometry results for details.  Skin Testing: {Blank single:19197::Select foods,Environmental allergy  panel,Environmental allergy panel and select foods,Food allergy panel,None,Deferred due to recent antihistamines use}. *** Results discussed with patient/family.   Medication List:  Current Outpatient Medications  Medication Sig Dispense Refill   cetirizine  (ZYRTEC  ALLERGY) 10 MG tablet Take 1 tablet (10 mg total) by mouth daily. 30 tablet 5   Cholecalciferol (VITAMIN D ) 50 MCG (2000 UT) CAPS Take by mouth.     ciclopirox  (PENLAC ) 8 % solution Apply topically at bedtime. Apply over nail and surrounding skin. Apply daily over previous coat. After seven (7) days, may remove with alcohol and continue cycle. 6.6 mL 12   desonide  (DESOWEN ) 0.05 % cream Apply twice daily for flare ups.  Maximum use 7 days. 30 g 5   meloxicam (MOBIC) 15 MG tablet Take 1 tablet by mouth daily.     Multiple Vitamins-Minerals (ZINC PO) Take by mouth.     rosuvastatin  (CRESTOR ) 40 MG tablet Take 1 tablet (40 mg total) by mouth at bedtime. 90 tablet 0   Vitamin D , Ergocalciferol , (DRISDOL ) 1.25 MG (50000 UNIT) CAPS capsule Take 1 capsule (50,000 Units total) by mouth every 7 (seven) days. 12 capsule 0   zolpidem  (AMBIEN ) 10 MG tablet Take 0.5-1 tablets (5-10 mg total) by mouth at bedtime as needed for sleep. 30 tablet 2   No current facility-administered medications for this visit.   Allergies: Allergies  Allergen Reactions   Penicillins     Has patient had a PCN reaction causing immediate rash, facial/tongue/throat swelling, SOB or lightheadedness with hypotension: N Has patient had a PCN reaction causing severe rash involving mucus membranes or skin necrosis: No Has patient had a PCN reaction that required hospitalization No Has patient had a PCN reaction occurring within the last 10 years: No If all of the above answers are NO, then may proceed with Cephalosporin use. CHILDHOOD ALLERGY -PATIENT CANNOT CONFIRM ANY OF THE REACTION   I reviewed his past medical history, social history, family history,  and environmental history and no significant changes have been reported from his previous visit.  Review of Systems  Constitutional:  Negative for appetite change, chills, fever and unexpected weight change.  HENT:  Negative for congestion and rhinorrhea.   Eyes:  Negative for itching.  Respiratory:  Negative for cough, chest tightness, shortness of breath and wheezing.   Cardiovascular:  Negative for chest pain.  Gastrointestinal:  Negative for abdominal pain.  Genitourinary:  Negative for difficulty urinating.  Skin:  Negative for rash.  Neurological:  Negative for headaches.    Objective: There were no vitals taken for this visit. There is no height or weight on file to calculate BMI. Physical Exam Vitals and nursing note reviewed.  Constitutional:      Appearance: Normal appearance. He is well-developed.  HENT:     Head: Normocephalic and atraumatic.     Right Ear: Tympanic membrane and external ear normal.     Left Ear: Tympanic membrane and external ear normal.     Nose: Nose normal.     Mouth/Throat:     Mouth: Mucous membranes are moist.     Pharynx: Oropharynx is clear.  Eyes:     Conjunctiva/sclera: Conjunctivae normal.  Cardiovascular:     Rate and Rhythm: Normal rate and regular rhythm.     Heart  sounds: Normal heart sounds. No murmur heard.    No friction rub. No gallop.  Pulmonary:     Effort: Pulmonary effort is normal.     Breath sounds: Normal breath sounds. No wheezing, rhonchi or rales.  Musculoskeletal:     Cervical back: Neck supple.  Skin:    General: Skin is warm.     Findings: Rash present.     Comments: Mild erythematous rash periorbitally b/l. Clear conjunctiva.  Neurological:     Mental Status: He is alert and oriented to person, place, and time.  Psychiatric:        Behavior: Behavior normal.    Previous notes and tests were reviewed. The plan was reviewed with the patient/family, and all questions/concerned were addressed.  It was my  pleasure to see Avan today and participate in his care. Please feel free to contact me with any questions or concerns.  Sincerely,  Orlan Cramp, DO Allergy & Immunology  [facility] office: [phone] [facility] office: [phone]

## 2023-04-20 ENCOUNTER — Ambulatory Visit: Payer: Self-pay | Admitting: Allergy

## 2023-04-20 DIAGNOSIS — J309 Allergic rhinitis, unspecified: Secondary | ICD-10-CM

## 2023-04-27 ENCOUNTER — Other Ambulatory Visit: Payer: Self-pay | Admitting: Internal Medicine

## 2023-04-27 NOTE — Telephone Encounter (Signed)
PDMP reviewed, too early for refill

## 2023-04-27 NOTE — Telephone Encounter (Signed)
Requesting: Ambien 10mg   Contract: None UDS: Ambien only Last Visit: 01/14/23 Next Visit: 01/25/24 Last Refill: 01/27/23 #30 and 2rf  Please Advise

## 2023-05-10 ENCOUNTER — Other Ambulatory Visit: Payer: Self-pay | Admitting: Internal Medicine

## 2023-05-10 NOTE — Telephone Encounter (Signed)
 Requesting: Ambien 10mg   Contract: None UDS: Ambien only Last Visit: 01/14/23 Next Visit: 01/25/24 Last Refill: 01/27/23 #30 and 2rf  Please Advise

## 2023-05-10 NOTE — Telephone Encounter (Signed)
PDMP okay, prescription sent 

## 2023-06-09 ENCOUNTER — Other Ambulatory Visit: Payer: Self-pay | Admitting: Internal Medicine

## 2023-10-23 ENCOUNTER — Other Ambulatory Visit: Payer: Self-pay | Admitting: Internal Medicine

## 2023-10-24 NOTE — Telephone Encounter (Signed)
 Requesting: zolpidem  10mg  Contract: No UDS: no Last Visit: 01/14/2023 Next Visit: 01/25/2024 Last Refill: 05/10/2023  Please Advise

## 2023-10-24 NOTE — Telephone Encounter (Signed)
 PDMP reviewed, too early for refill

## 2023-10-31 ENCOUNTER — Telehealth: Payer: Self-pay | Admitting: Internal Medicine

## 2023-10-31 NOTE — Telephone Encounter (Signed)
 Requesting: Ambien  10mg   Contract: None UDS: Ambien  only Last Visit: 01/14/23 Next Visit: 01/25/24 Last Refill: 05/10/23 #30 and 4RF  Please Advise

## 2023-10-31 NOTE — Telephone Encounter (Signed)
 PDMP okay, Rx sent

## 2023-12-25 ENCOUNTER — Other Ambulatory Visit: Payer: Self-pay | Admitting: Internal Medicine

## 2024-01-16 ENCOUNTER — Encounter: Admitting: Internal Medicine

## 2024-01-22 ENCOUNTER — Other Ambulatory Visit: Payer: Self-pay | Admitting: Internal Medicine

## 2024-01-25 ENCOUNTER — Encounter: Payer: 59 | Admitting: Internal Medicine

## 2024-04-17 ENCOUNTER — Ambulatory Visit: Admitting: Internal Medicine

## 2024-04-17 ENCOUNTER — Encounter: Payer: Self-pay | Admitting: Internal Medicine

## 2024-04-17 VITALS — BP 126/86 | HR 71 | Temp 98.3°F | Resp 16 | Ht 68.0 in | Wt 171.2 lb

## 2024-04-17 DIAGNOSIS — Z8042 Family history of malignant neoplasm of prostate: Secondary | ICD-10-CM

## 2024-04-17 DIAGNOSIS — Z23 Encounter for immunization: Secondary | ICD-10-CM

## 2024-04-17 DIAGNOSIS — E7800 Pure hypercholesterolemia, unspecified: Secondary | ICD-10-CM

## 2024-04-17 DIAGNOSIS — F419 Anxiety disorder, unspecified: Secondary | ICD-10-CM

## 2024-04-17 DIAGNOSIS — E559 Vitamin D deficiency, unspecified: Secondary | ICD-10-CM

## 2024-04-17 DIAGNOSIS — Z0000 Encounter for general adult medical examination without abnormal findings: Secondary | ICD-10-CM

## 2024-04-17 DIAGNOSIS — Z0001 Encounter for general adult medical examination with abnormal findings: Secondary | ICD-10-CM

## 2024-04-17 LAB — CBC WITH DIFFERENTIAL/PLATELET
Basophils Absolute: 0 10*3/uL (ref 0.0–0.1)
Basophils Relative: 0.6 % (ref 0.0–3.0)
Eosinophils Absolute: 0 10*3/uL (ref 0.0–0.7)
Eosinophils Relative: 0.4 % (ref 0.0–5.0)
HCT: 39 % (ref 39.0–52.0)
Hemoglobin: 13.4 g/dL (ref 13.0–17.0)
Lymphocytes Relative: 23.2 % (ref 12.0–46.0)
Lymphs Abs: 1.4 10*3/uL (ref 0.7–4.0)
MCHC: 34.4 g/dL (ref 30.0–36.0)
MCV: 83.6 fl (ref 78.0–100.0)
Monocytes Absolute: 0.3 10*3/uL (ref 0.1–1.0)
Monocytes Relative: 5 % (ref 3.0–12.0)
Neutro Abs: 4.3 10*3/uL (ref 1.4–7.7)
Neutrophils Relative %: 70.8 % (ref 43.0–77.0)
Platelets: 239 10*3/uL (ref 150.0–400.0)
RBC: 4.66 Mil/uL (ref 4.22–5.81)
RDW: 13 % (ref 11.5–15.5)
WBC: 6.1 10*3/uL (ref 4.0–10.5)

## 2024-04-17 LAB — COMPREHENSIVE METABOLIC PANEL WITH GFR
ALT: 28 U/L (ref 3–53)
AST: 24 U/L (ref 5–37)
Albumin: 4.6 g/dL (ref 3.5–5.2)
Alkaline Phosphatase: 57 U/L (ref 39–117)
BUN: 22 mg/dL (ref 6–23)
CO2: 27 meq/L (ref 19–32)
Calcium: 9.1 mg/dL (ref 8.4–10.5)
Chloride: 100 meq/L (ref 96–112)
Creatinine, Ser: 1.14 mg/dL (ref 0.40–1.50)
GFR: 74.9 mL/min
Glucose, Bld: 115 mg/dL — ABNORMAL HIGH (ref 70–99)
Potassium: 4.2 meq/L (ref 3.5–5.1)
Sodium: 135 meq/L (ref 135–145)
Total Bilirubin: 1.4 mg/dL — ABNORMAL HIGH (ref 0.2–1.2)
Total Protein: 6.7 g/dL (ref 6.0–8.3)

## 2024-04-17 LAB — LIPID PANEL
Cholesterol: 226 mg/dL — ABNORMAL HIGH (ref 28–200)
HDL: 64 mg/dL
LDL Cholesterol: 104 mg/dL — ABNORMAL HIGH (ref 10–99)
NonHDL: 161.76
Total CHOL/HDL Ratio: 4
Triglycerides: 289 mg/dL — ABNORMAL HIGH (ref 10.0–149.0)
VLDL: 57.8 mg/dL — ABNORMAL HIGH (ref 0.0–40.0)

## 2024-04-17 LAB — PSA: PSA: 0.48 ng/mL (ref 0.10–4.00)

## 2024-04-17 MED ORDER — ROSUVASTATIN CALCIUM 40 MG PO TABS: 40.0000 mg | ORAL_TABLET | Freq: Every day | ORAL | 3 refills | Status: AC

## 2024-04-17 MED ORDER — MELOXICAM 15 MG PO TABS: 15.0000 mg | ORAL_TABLET | Freq: Every day | ORAL | 3 refills | Status: AC

## 2024-04-17 NOTE — Patient Instructions (Signed)
 Please read your instructions carefully.   GO TO THE LAB :  Get the blood work    Go to the front desk for the checkout Please make an appointment for a checkup in 6 months

## 2024-04-18 ENCOUNTER — Ambulatory Visit: Payer: Self-pay

## 2024-04-18 ENCOUNTER — Encounter: Payer: Self-pay | Admitting: Internal Medicine

## 2024-04-18 ENCOUNTER — Other Ambulatory Visit: Payer: Self-pay

## 2024-04-18 DIAGNOSIS — R739 Hyperglycemia, unspecified: Secondary | ICD-10-CM

## 2024-04-18 LAB — HEPATITIS B SURFACE ANTIBODY,QUALITATIVE: Hep B S Ab: NONREACTIVE

## 2024-04-18 LAB — VITAMIN D 25 HYDROXY (VIT D DEFICIENCY, FRACTURES): VITD: 35.01 ng/mL (ref 30.00–100.00)

## 2024-04-18 LAB — HEPATITIS B SURFACE ANTIGEN: Hepatitis B Surface Ag: NONREACTIVE

## 2024-04-18 NOTE — Assessment & Plan Note (Signed)
 Here for CPX  - Td 2023 - Vaccine advice: Benefits of flu, shingles and COVID-vaccine discussed. - CCS: had cscope 2017 d/t rectal bleed, wnl, next Cscope 10 years - Prostate cancer screening: FH, father.  No symptoms, DRE negative, check PSA -Has a healthy lifestyle, remains active. -Lab: CMP FLP CBC PSA, hep B serology, vit D

## 2024-04-18 NOTE — Assessment & Plan Note (Signed)
 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 Managing with online counseling but faces financial constraints limit ongoing counseling.  Nevertheless is encouraged to continue if at all possible. Denies any suicidal or violent thoughts High cholesterol: Inconsistent adherence to rosuvastatin  due to stressors. Acknowledges need to resume medication.  Refill sent, encouraged good compliance, if FLP is not at goal we will recheck in few months. Vitamin D  deficiency Inconsistent vitamin D  intake, 2000 units daily. Plan to check levels . Insomnia Sleeplessness related to stress and anxiety. Continue zolpidem  as needed for sleep.  She takes infrequently RTC 6 months

## 2024-04-19 ENCOUNTER — Ambulatory Visit (INDEPENDENT_AMBULATORY_CARE_PROVIDER_SITE_OTHER)

## 2024-04-19 DIAGNOSIS — R739 Hyperglycemia, unspecified: Secondary | ICD-10-CM | POA: Diagnosis not present

## 2024-04-19 LAB — HEMOGLOBIN A1C: Hgb A1c MFr Bld: 5.8 % (ref 4.6–6.5)

## 2024-04-19 MED ORDER — CICLOPIROX 8 % EX SOLN: Freq: Every day | CUTANEOUS | 12 refills | Status: AC

## 2024-10-16 ENCOUNTER — Ambulatory Visit: Admitting: Internal Medicine
